# Patient Record
Sex: Female | Born: 1965 | Race: White | Hispanic: No | Marital: Married | Smoking: Former smoker
Health system: Southern US, Community
[De-identification: ages and names within clinical notes are randomized; demographics above are authoritative.]

## PROBLEM LIST (undated history)

## (undated) DIAGNOSIS — Z862 Personal history of diseases of the blood and blood-forming organs and certain disorders involving the immune mechanism: Secondary | ICD-10-CM

## (undated) DIAGNOSIS — Z8719 Personal history of other diseases of the digestive system: Secondary | ICD-10-CM

## (undated) DIAGNOSIS — M1990 Unspecified osteoarthritis, unspecified site: Secondary | ICD-10-CM

## (undated) DIAGNOSIS — E119 Type 2 diabetes mellitus without complications: Secondary | ICD-10-CM

## (undated) DIAGNOSIS — K449 Diaphragmatic hernia without obstruction or gangrene: Secondary | ICD-10-CM

## (undated) DIAGNOSIS — Z8679 Personal history of other diseases of the circulatory system: Secondary | ICD-10-CM

## (undated) DIAGNOSIS — T8859XA Other complications of anesthesia, initial encounter: Secondary | ICD-10-CM

## (undated) DIAGNOSIS — Z973 Presence of spectacles and contact lenses: Secondary | ICD-10-CM

## (undated) DIAGNOSIS — K219 Gastro-esophageal reflux disease without esophagitis: Secondary | ICD-10-CM

## (undated) DIAGNOSIS — T4145XA Adverse effect of unspecified anesthetic, initial encounter: Secondary | ICD-10-CM

## (undated) DIAGNOSIS — Z85820 Personal history of malignant melanoma of skin: Secondary | ICD-10-CM

## (undated) DIAGNOSIS — M5136 Other intervertebral disc degeneration, lumbar region: Secondary | ICD-10-CM

## (undated) DIAGNOSIS — J449 Chronic obstructive pulmonary disease, unspecified: Secondary | ICD-10-CM

## (undated) DIAGNOSIS — R351 Nocturia: Secondary | ICD-10-CM

## (undated) DIAGNOSIS — G4733 Obstructive sleep apnea (adult) (pediatric): Secondary | ICD-10-CM

## (undated) DIAGNOSIS — Z8544 Personal history of malignant neoplasm of other female genital organs: Secondary | ICD-10-CM

## (undated) DIAGNOSIS — Z9884 Bariatric surgery status: Secondary | ICD-10-CM

## (undated) DIAGNOSIS — Z87442 Personal history of urinary calculi: Secondary | ICD-10-CM

## (undated) DIAGNOSIS — Z9889 Other specified postprocedural states: Secondary | ICD-10-CM

## (undated) DIAGNOSIS — D071 Carcinoma in situ of vulva: Secondary | ICD-10-CM

## (undated) DIAGNOSIS — M5126 Other intervertebral disc displacement, lumbar region: Secondary | ICD-10-CM

## (undated) DIAGNOSIS — Z87898 Personal history of other specified conditions: Secondary | ICD-10-CM

## (undated) HISTORY — PX: KNEE ARTHROSCOPY: SUR90

## (undated) HISTORY — PX: CARDIAC CATHETERIZATION: SHX172

## (undated) HISTORY — PX: EXTRACORPOREAL SHOCK WAVE LITHOTRIPSY: SHX1557

## (undated) HISTORY — DX: Gastro-esophageal reflux disease without esophagitis: K21.9

---
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
## 1998-10-25 ENCOUNTER — Ambulatory Visit (HOSPITAL_COMMUNITY): Admission: RE | Admit: 1998-10-25 | Discharge: 1998-10-25 | Payer: Self-pay | Admitting: Obstetrics and Gynecology

## 1998-10-25 ENCOUNTER — Encounter: Payer: Self-pay | Admitting: Obstetrics and Gynecology

## 1999-01-24 ENCOUNTER — Other Ambulatory Visit: Admission: RE | Admit: 1999-01-24 | Discharge: 1999-01-24 | Payer: Self-pay | Admitting: Obstetrics and Gynecology

## 1999-04-26 ENCOUNTER — Emergency Department (HOSPITAL_COMMUNITY): Admission: EM | Admit: 1999-04-26 | Discharge: 1999-04-27 | Payer: Self-pay | Admitting: Emergency Medicine

## 1999-10-11 ENCOUNTER — Encounter: Admission: RE | Admit: 1999-10-11 | Discharge: 2000-01-09 | Payer: Self-pay | Admitting: Obstetrics and Gynecology

## 2000-01-16 ENCOUNTER — Ambulatory Visit (HOSPITAL_COMMUNITY): Admission: RE | Admit: 2000-01-16 | Discharge: 2000-01-16 | Payer: Self-pay | Admitting: Obstetrics and Gynecology

## 2000-01-16 ENCOUNTER — Encounter: Payer: Self-pay | Admitting: Obstetrics and Gynecology

## 2000-02-24 ENCOUNTER — Encounter: Payer: Self-pay | Admitting: Obstetrics and Gynecology

## 2000-02-24 ENCOUNTER — Ambulatory Visit (HOSPITAL_COMMUNITY): Admission: RE | Admit: 2000-02-24 | Discharge: 2000-02-24 | Payer: Self-pay | Admitting: Obstetrics and Gynecology

## 2000-02-26 ENCOUNTER — Ambulatory Visit: Admission: AD | Admit: 2000-02-26 | Discharge: 2000-02-26 | Payer: Self-pay | Admitting: Obstetrics and Gynecology

## 2000-03-02 ENCOUNTER — Inpatient Hospital Stay (HOSPITAL_COMMUNITY): Admission: AD | Admit: 2000-03-02 | Discharge: 2000-03-06 | Payer: Self-pay | Admitting: Obstetrics & Gynecology

## 2000-03-07 ENCOUNTER — Encounter: Admission: RE | Admit: 2000-03-07 | Discharge: 2000-04-21 | Payer: Self-pay | Admitting: Obstetrics and Gynecology

## 2000-06-29 ENCOUNTER — Ambulatory Visit (HOSPITAL_COMMUNITY): Admission: RE | Admit: 2000-06-29 | Discharge: 2000-06-29 | Payer: Self-pay | Admitting: Internal Medicine

## 2001-02-23 ENCOUNTER — Ambulatory Visit (HOSPITAL_COMMUNITY): Admission: RE | Admit: 2001-02-23 | Discharge: 2001-02-23 | Payer: Self-pay | Admitting: Obstetrics and Gynecology

## 2001-02-23 ENCOUNTER — Encounter: Payer: Self-pay | Admitting: Obstetrics and Gynecology

## 2001-07-07 ENCOUNTER — Inpatient Hospital Stay (HOSPITAL_COMMUNITY): Admission: AD | Admit: 2001-07-07 | Discharge: 2001-07-10 | Payer: Self-pay | Admitting: Obstetrics and Gynecology

## 2001-07-07 ENCOUNTER — Encounter (INDEPENDENT_AMBULATORY_CARE_PROVIDER_SITE_OTHER): Payer: Self-pay | Admitting: Specialist

## 2003-04-07 ENCOUNTER — Emergency Department (HOSPITAL_COMMUNITY): Admission: EM | Admit: 2003-04-07 | Discharge: 2003-04-07 | Payer: Self-pay | Admitting: Emergency Medicine

## 2003-04-07 ENCOUNTER — Encounter: Payer: Self-pay | Admitting: Emergency Medicine

## 2003-07-12 ENCOUNTER — Emergency Department (HOSPITAL_COMMUNITY): Admission: EM | Admit: 2003-07-12 | Discharge: 2003-07-12 | Payer: Self-pay | Admitting: Emergency Medicine

## 2003-07-12 ENCOUNTER — Encounter: Payer: Self-pay | Admitting: Emergency Medicine

## 2003-07-19 ENCOUNTER — Ambulatory Visit (HOSPITAL_BASED_OUTPATIENT_CLINIC_OR_DEPARTMENT_OTHER): Admission: RE | Admit: 2003-07-19 | Discharge: 2003-07-19 | Payer: Self-pay | Admitting: Urology

## 2003-07-19 HISTORY — PX: OTHER SURGICAL HISTORY: SHX169

## 2005-01-18 ENCOUNTER — Emergency Department (HOSPITAL_COMMUNITY): Admission: EM | Admit: 2005-01-18 | Discharge: 2005-01-18 | Payer: Self-pay | Admitting: Emergency Medicine

## 2005-01-23 ENCOUNTER — Encounter: Admission: RE | Admit: 2005-01-23 | Discharge: 2005-01-23 | Payer: Self-pay | Admitting: Orthopaedic Surgery

## 2007-06-09 ENCOUNTER — Emergency Department (HOSPITAL_COMMUNITY): Admission: EM | Admit: 2007-06-09 | Discharge: 2007-06-09 | Payer: Self-pay | Admitting: Emergency Medicine

## 2007-07-31 ENCOUNTER — Emergency Department (HOSPITAL_COMMUNITY): Admission: EM | Admit: 2007-07-31 | Discharge: 2007-08-01 | Payer: Self-pay | Admitting: Emergency Medicine

## 2007-10-13 ENCOUNTER — Encounter: Admission: RE | Admit: 2007-10-13 | Discharge: 2007-10-14 | Payer: Self-pay | Admitting: Surgery

## 2007-10-16 ENCOUNTER — Ambulatory Visit (HOSPITAL_BASED_OUTPATIENT_CLINIC_OR_DEPARTMENT_OTHER): Admission: RE | Admit: 2007-10-16 | Discharge: 2007-10-16 | Payer: Self-pay | Admitting: Surgery

## 2007-10-21 ENCOUNTER — Ambulatory Visit (HOSPITAL_COMMUNITY): Admission: RE | Admit: 2007-10-21 | Discharge: 2007-10-21 | Payer: Self-pay | Admitting: Surgery

## 2007-10-24 ENCOUNTER — Ambulatory Visit: Payer: Self-pay | Admitting: Internal Medicine

## 2007-10-25 ENCOUNTER — Ambulatory Visit (HOSPITAL_COMMUNITY): Admission: RE | Admit: 2007-10-25 | Discharge: 2007-10-25 | Payer: Self-pay | Admitting: Surgery

## 2007-12-13 ENCOUNTER — Ambulatory Visit (HOSPITAL_COMMUNITY): Admission: RE | Admit: 2007-12-13 | Discharge: 2007-12-13 | Payer: Self-pay | Admitting: Surgery

## 2008-01-31 ENCOUNTER — Encounter: Admission: RE | Admit: 2008-01-31 | Discharge: 2008-04-30 | Payer: Self-pay | Admitting: Surgery

## 2008-02-14 ENCOUNTER — Ambulatory Visit (HOSPITAL_COMMUNITY): Admission: RE | Admit: 2008-02-14 | Discharge: 2008-02-15 | Payer: Self-pay | Admitting: Surgery

## 2008-02-14 HISTORY — PX: LAPAROSCOPIC GASTRIC BANDING: SHX1100

## 2008-02-19 ENCOUNTER — Emergency Department (HOSPITAL_COMMUNITY): Admission: EM | Admit: 2008-02-19 | Discharge: 2008-02-19 | Payer: Self-pay | Admitting: Emergency Medicine

## 2008-04-21 ENCOUNTER — Ambulatory Visit (HOSPITAL_COMMUNITY): Admission: RE | Admit: 2008-04-21 | Discharge: 2008-04-21 | Payer: Self-pay | Admitting: Urology

## 2008-04-28 ENCOUNTER — Encounter: Admission: RE | Admit: 2008-04-28 | Discharge: 2008-04-28 | Payer: Self-pay | Admitting: Surgery

## 2008-06-20 ENCOUNTER — Ambulatory Visit (HOSPITAL_COMMUNITY): Admission: RE | Admit: 2008-06-20 | Discharge: 2008-06-20 | Payer: Self-pay | Admitting: Urology

## 2008-07-03 ENCOUNTER — Ambulatory Visit (HOSPITAL_COMMUNITY): Admission: RE | Admit: 2008-07-03 | Discharge: 2008-07-03 | Payer: Self-pay | Admitting: Urology

## 2008-07-27 ENCOUNTER — Ambulatory Visit (HOSPITAL_BASED_OUTPATIENT_CLINIC_OR_DEPARTMENT_OTHER): Admission: RE | Admit: 2008-07-27 | Discharge: 2008-07-27 | Payer: Self-pay | Admitting: Orthopedic Surgery

## 2008-07-27 HISTORY — PX: ELBOW DEBRIDEMENT: SHX931

## 2008-10-05 ENCOUNTER — Ambulatory Visit (HOSPITAL_COMMUNITY): Admission: RE | Admit: 2008-10-05 | Discharge: 2008-10-05 | Payer: Self-pay | Admitting: Urology

## 2008-10-05 IMAGING — CR DG ABDOMEN 1V
2 series · 2 of 2 positions shown · non-contrast
Comparison: [DATE]

CLINICAL DATA: Left renal calculus.

ABDOMEN - 1 VIEW

[t abdomen supine *]
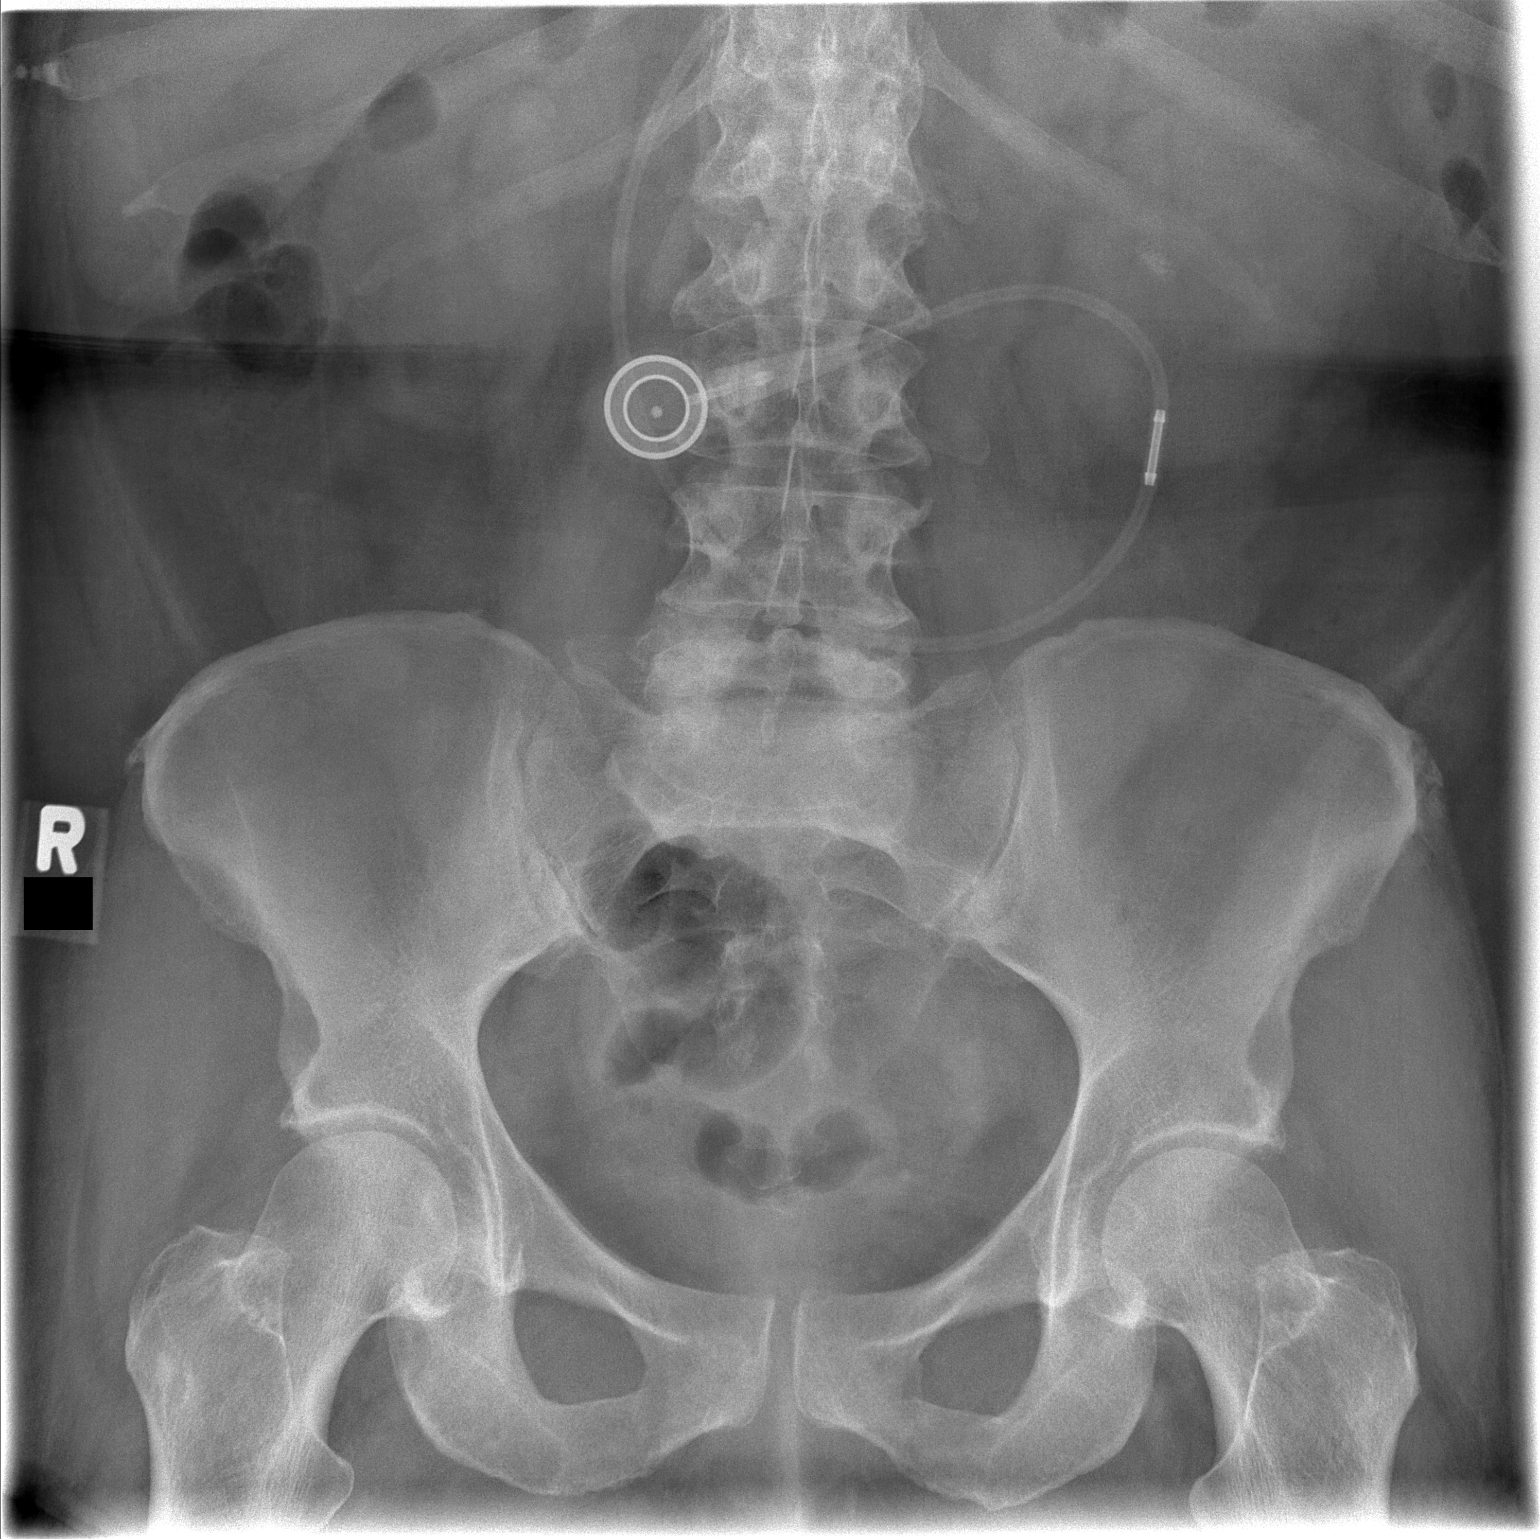

[t abdomen supine]
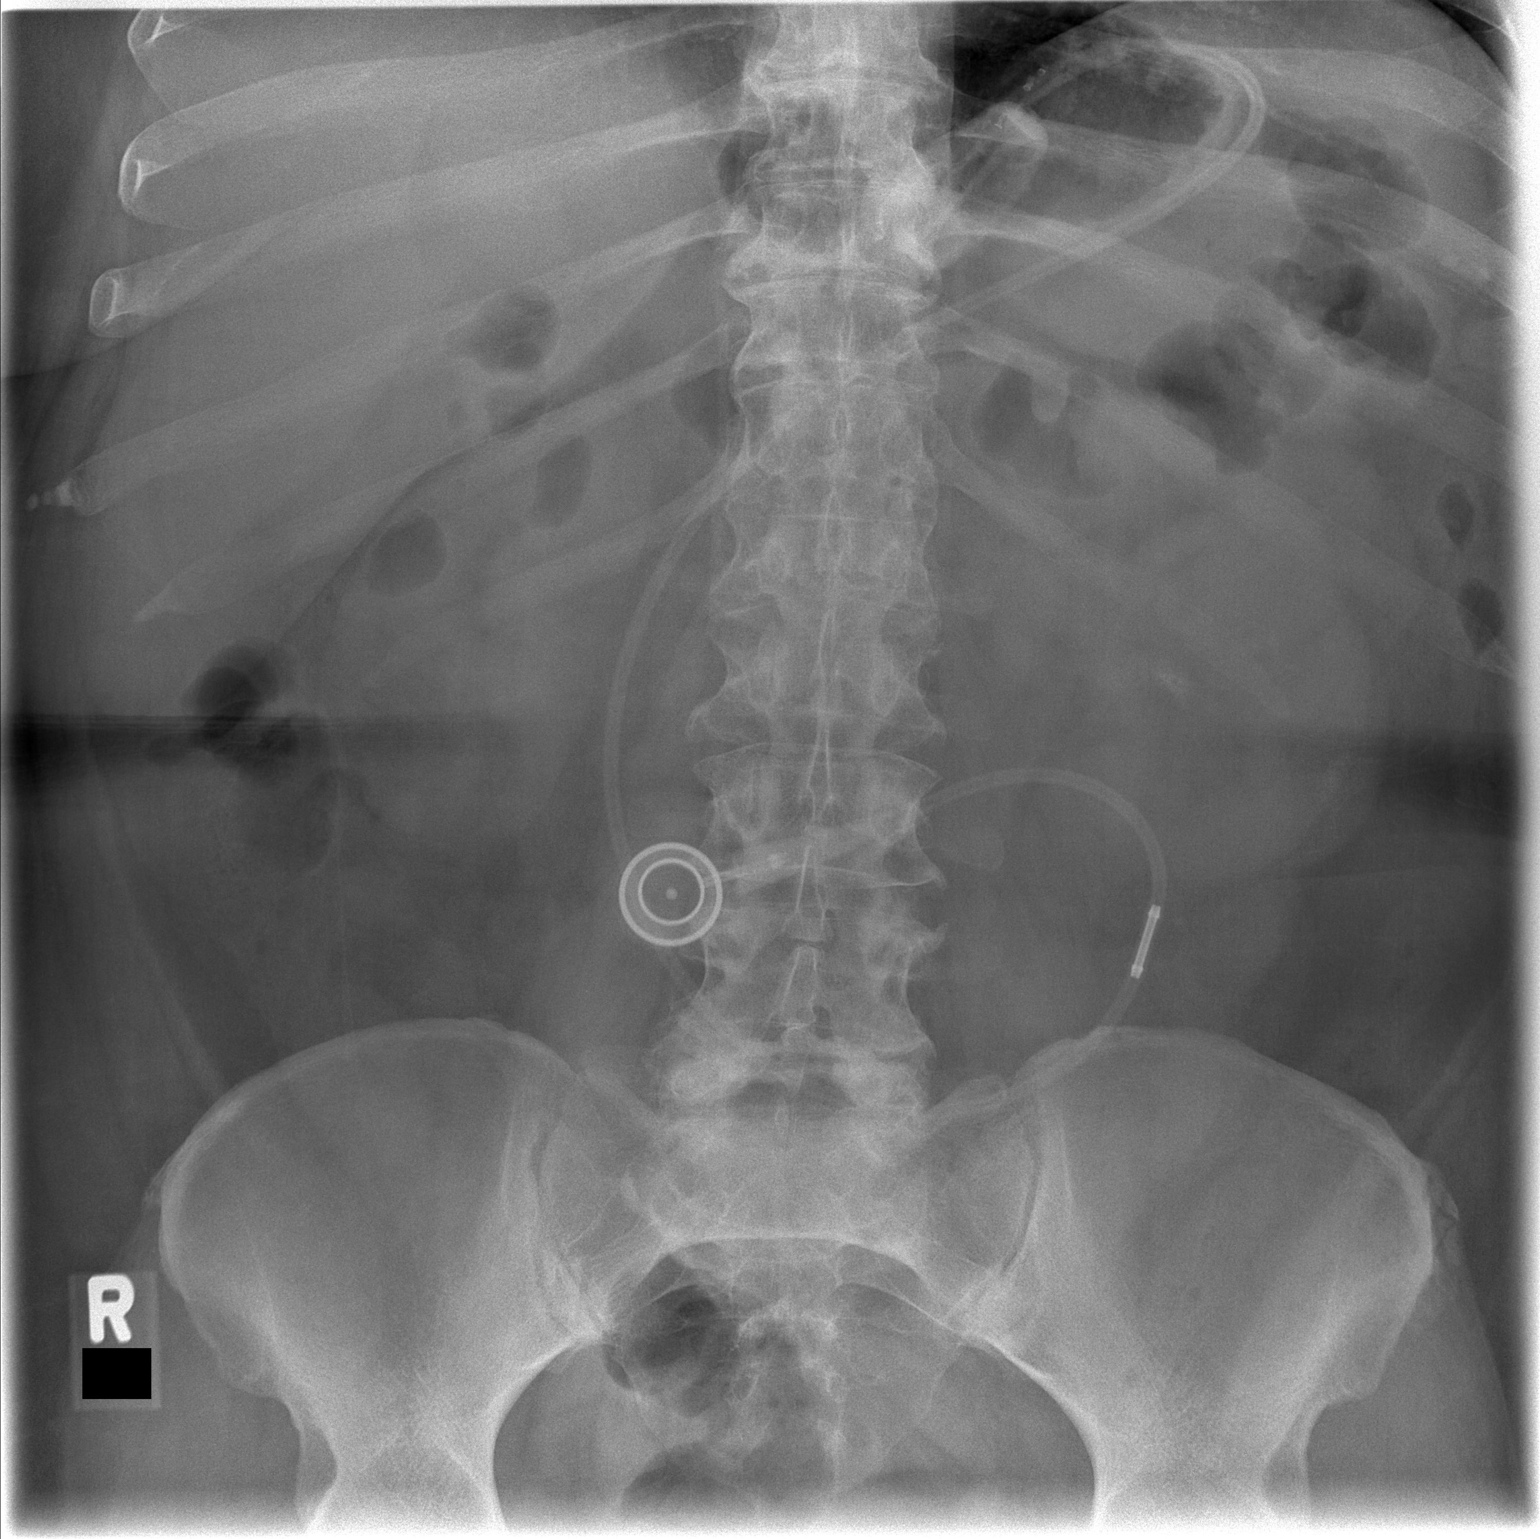

[2 of 2 positions shown; findings below may reference images not displayed]

FINDINGS: The patient has a left and with port in place.  A
calculus overlies the left renal contour, measuring 10 mm in
diameter.  Bowel gas pattern is nonobstructive.
IMPRESSION: Stable appearance of left renal calculus.

## 2008-12-07 ENCOUNTER — Ambulatory Visit (HOSPITAL_BASED_OUTPATIENT_CLINIC_OR_DEPARTMENT_OTHER): Admission: RE | Admit: 2008-12-07 | Discharge: 2008-12-07 | Payer: Self-pay | Admitting: Orthopedic Surgery

## 2008-12-11 ENCOUNTER — Ambulatory Visit (HOSPITAL_COMMUNITY): Admission: RE | Admit: 2008-12-11 | Discharge: 2008-12-11 | Payer: Self-pay | Admitting: General Surgery

## 2008-12-19 ENCOUNTER — Ambulatory Visit (HOSPITAL_COMMUNITY): Admission: RE | Admit: 2008-12-19 | Discharge: 2008-12-20 | Payer: Self-pay | Admitting: Surgery

## 2008-12-19 ENCOUNTER — Encounter (INDEPENDENT_AMBULATORY_CARE_PROVIDER_SITE_OTHER): Payer: Self-pay | Admitting: Surgery

## 2008-12-19 HISTORY — PX: OTHER SURGICAL HISTORY: SHX169

## 2010-03-19 ENCOUNTER — Ambulatory Visit (HOSPITAL_COMMUNITY): Admission: RE | Admit: 2010-03-19 | Discharge: 2010-03-19 | Payer: Self-pay | Admitting: Obstetrics and Gynecology

## 2010-03-19 HISTORY — PX: OTHER SURGICAL HISTORY: SHX169

## 2010-04-03 ENCOUNTER — Ambulatory Visit: Admission: RE | Admit: 2010-04-03 | Discharge: 2010-04-03 | Payer: Self-pay | Admitting: Gynecologic Oncology

## 2010-04-11 ENCOUNTER — Ambulatory Visit (HOSPITAL_COMMUNITY): Admission: RE | Admit: 2010-04-11 | Discharge: 2010-04-11 | Payer: Self-pay | Admitting: Gynecologic Oncology

## 2010-05-28 ENCOUNTER — Ambulatory Visit: Admission: RE | Admit: 2010-05-28 | Discharge: 2010-05-28 | Payer: Self-pay | Admitting: Gynecologic Oncology

## 2010-06-11 ENCOUNTER — Ambulatory Visit (HOSPITAL_COMMUNITY): Admission: RE | Admit: 2010-06-11 | Discharge: 2010-06-12 | Payer: Self-pay | Admitting: Obstetrics & Gynecology

## 2010-06-11 ENCOUNTER — Encounter: Payer: Self-pay | Admitting: Obstetrics & Gynecology

## 2010-06-11 HISTORY — PX: OTHER SURGICAL HISTORY: SHX169

## 2010-07-03 ENCOUNTER — Ambulatory Visit: Admission: RE | Admit: 2010-07-03 | Discharge: 2010-07-03 | Payer: Self-pay | Admitting: Gynecologic Oncology

## 2010-09-12 ENCOUNTER — Ambulatory Visit
Admission: RE | Admit: 2010-09-12 | Discharge: 2010-09-12 | Payer: Self-pay | Source: Home / Self Care | Admitting: Gynecologic Oncology

## 2010-11-24 ENCOUNTER — Encounter: Payer: Self-pay | Admitting: General Surgery

## 2011-01-08 ENCOUNTER — Other Ambulatory Visit: Payer: Self-pay | Admitting: Gynecologic Oncology

## 2011-01-08 ENCOUNTER — Ambulatory Visit: Payer: 59 | Attending: Gynecologic Oncology | Admitting: Gynecologic Oncology

## 2011-01-08 ENCOUNTER — Other Ambulatory Visit (HOSPITAL_COMMUNITY)
Admission: RE | Admit: 2011-01-08 | Discharge: 2011-01-08 | Disposition: A | Discharge status: Home / Self Care | Payer: 59 | Source: Ambulatory Visit | Attending: Gynecologic Oncology | Admitting: Gynecologic Oncology

## 2011-01-08 DIAGNOSIS — Z8540 Personal history of malignant neoplasm of unspecified female genital organ: Secondary | ICD-10-CM | POA: Insufficient documentation

## 2011-01-08 DIAGNOSIS — K625 Hemorrhage of anus and rectum: Secondary | ICD-10-CM | POA: Insufficient documentation

## 2011-01-08 DIAGNOSIS — C519 Malignant neoplasm of vulva, unspecified: Secondary | ICD-10-CM | POA: Insufficient documentation

## 2011-01-17 LAB — CBC
Hemoglobin: 12.6 g/dL (ref 12.0–15.0)
MCH: 29.7 pg (ref 26.0–34.0)
MCHC: 33.5 g/dL (ref 30.0–36.0)
MCV: 88.6 fL (ref 78.0–100.0)

## 2011-01-17 LAB — COMPREHENSIVE METABOLIC PANEL
AST: 16 U/L (ref 0–37)
Alkaline Phosphatase: 76 U/L (ref 39–117)
BUN: 6 mg/dL (ref 6–23)
CO2: 25 mEq/L (ref 19–32)
Calcium: 8.8 mg/dL (ref 8.4–10.5)
Creatinine, Ser: 0.65 mg/dL (ref 0.4–1.2)
GFR calc Af Amer: >60 mL/min (ref >60)

## 2011-01-17 LAB — HEMOGLOBIN A1C
Hgb A1c MFr Bld: 7.3 % — ABNORMAL HIGH (ref <5.7)
Mean Plasma Glucose: 163 mg/dL — ABNORMAL HIGH (ref <117)

## 2011-01-17 LAB — DIFFERENTIAL
Basophils Relative: 1 % (ref 0–1)
Eosinophils Absolute: 0.1 10*3/uL (ref 0.0–0.7)
Monocytes Absolute: 0.4 10*3/uL (ref 0.1–1.0)
Monocytes Relative: 5 % (ref 3–12)

## 2011-01-17 LAB — GLUCOSE, CAPILLARY: Glucose-Capillary: 100 mg/dL — ABNORMAL HIGH (ref 70–99)

## 2011-01-20 LAB — BASIC METABOLIC PANEL
BUN: 4 mg/dL — ABNORMAL LOW (ref 6–23)
Calcium: 8.7 mg/dL (ref 8.4–10.5)
Creatinine, Ser: 0.6 mg/dL (ref 0.4–1.2)
GFR calc non Af Amer: >60 mL/min (ref >60)
Glucose, Bld: 104 mg/dL — ABNORMAL HIGH (ref 70–99)

## 2011-01-20 LAB — CBC
Platelets: 299 10*3/uL (ref 150–400)
RDW: 13.8 % (ref 11.5–15.5)

## 2011-01-24 NOTE — Consult Note (Signed)
NAME:  Ariel, Stewart NO.:  [id number]  MEDICAL RECORD NO.:  [id number]           PATIENT TYPE:  LOCATION:                                 FACILITY:  PHYSICIAN:  Marquette Blodgett A. Duard Brady, MD    DATE OF BIRTH:  January 23, 1966  DATE OF CONSULTATION:  01/08/2011 DATE OF DISCHARGE:                                CONSULTATION   HISTORY:  Ms. Zwiebel is a 45 year old with stage IB vulvar carcinoma. She saw Dr. Dareen Piano initially in May 2011.  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  PET-CT revealed no evidence of any abnormal uptake.  There was 1 external iliac node, measuring 1.2 cm with no uptake.  She underwent repeat wide local excision and bilateral groin dissection in August 2011.  There was a 2-cm left groin node that was slightly enlarged but soft and pathology on the right side was 0 out of 5 and on the left 0 out of 2 lymph nodes being involved.  The vulva revealed focal high-grade dysplasia extending to the margins, but there was no residual invasive carcinoma.  I last saw her in November of 2011, at which time her exam was unremarkable.  She comes in today for followup.  Since we last saw her, she has had some episodes of abdominal pain.  Per her report, she had a CT scan ordered by her primary care physician that was essentially negative.  She was having some rectal bleeding and she underwent a colonoscopy in the early part of February that showed a benign polyp and some hemorrhoids.  This was all done in [place], again we do not have the records.  She states that on Monday she had an episode of some left lower quadrant discomfort.  She felt that she was going to have diarrhea.  When she went to the restroom, she states that it essentially  filled the commode with blood with some small clots.  This was on Monday evening and has not happened since that time.  She states that she did have a followup with a gastroenterologist but failed to keep that appointment and she has not called them about this most recent episode.  She otherwise has been doing fairly well.  She does complain of some occasional right lower quadrant discomfort when she has an orgasm but no dyspareunia.  She denies any vaginal bleeding.  She states her husband has been looking at her vulva and they have not seen any lesions.  She has felt no pruritus and has not felt anything.  Review of systems is otherwise negative. She has gained about 10 pounds since we last saw her but she attributes that to stress and eating due to the holidays.  Her husband is still seeking appointment since his layoff.  PHYSICAL EXAMINATION:  VITAL SIGNS:  Weight 260 pounds, up from 250 pounds, blood pressure 122/70, pulse 82. GENERAL:  Well-nourished, well-developed female, in no  acute distress. NECK:  Supple.  There is no lymphadenopathy, no thyromegaly. LUNGS:  Clear to auscultation bilaterally. CARDIOVASCULAR:  Regular rate and rhythm. ABDOMEN:  Morbidly obese, soft, nontender, nondistended.  There are no palpable masses or hepatosplenomegaly but exam is limited by habitus. Groins are negative for adenopathy. EXTREMITIES:  There is no edema.  She does have varicose veins. PELVIC:  External genitalia is notable for surgical excision.  There are no visible lesions.  The vagina is well epithelialized.  The cervix is difficult to visualize secondary to the length of the speculum and the patient's habitus.  However, there were no gross visible lesions. ThinPrep Pap was submitted without difficulty.  Bimanual examination reveals no cervical motion tenderness.  Uterus is not enlarged, but again exam is limited by habitus.  There were no adnexal masses.  ASSESSMENT:  45 year old with  stage IB vulvar carcinoma, who clinically has no evidence of recurrent disease.  PLAN: 1. Will follow up with results of her Pap smear from today. 2. With regard to her rectal bleeding, she does have a fairly     prominent external hemorrhoid but I am not sure that is the cause     of the bleeding she had.  She was encouraged to call the     gastroenterologist for followup recommendations. 3. She did sign a release of information form so we could get records     from her CT scan as well as colonoscopy. 4. She will return to see [place] in 4 months and will follow up on the     results of her Pap smear.     Takia Runyon A. Duard Brady, MD     PAG/MEDQ  D:  01/08/2011  T:  01/08/2011  Job:  [id number]  cc:   Malva Limes, M.D. Fax: [phone]  Telford Nab, R.N. [address] [place], [place] [address]  Thornton Park Daphine Deutscher, MD [address], [address] [place] [address]  Dr. Primus Bravo  Electronically Signed by Cleda Mccreedy MD on 01/09/2011 11:42:20 AM

## 2011-02-18 LAB — GLUCOSE, CAPILLARY
Glucose-Capillary: 101 mg/dL — ABNORMAL HIGH (ref 70–99)
Glucose-Capillary: 125 mg/dL — ABNORMAL HIGH (ref 70–99)
Glucose-Capillary: 141 mg/dL — ABNORMAL HIGH (ref 70–99)

## 2011-02-18 LAB — BASIC METABOLIC PANEL
BUN: 10 mg/dL (ref 6–23)
Calcium: 8.9 mg/dL (ref 8.4–10.5)
Calcium: 9.4 mg/dL (ref 8.4–10.5)
GFR calc Af Amer: >60 mL/min (ref >60)
GFR calc non Af Amer: >60 mL/min (ref >60)
GFR calc non Af Amer: >60 mL/min (ref >60)
Potassium: 4.5 mEq/L (ref 3.5–5.1)
Sodium: 139 mEq/L (ref 135–145)

## 2011-02-18 LAB — CBC
HCT: 39.6 % (ref 36.0–46.0)
MCHC: 34.8 g/dL (ref 30.0–36.0)
Platelets: 321 10*3/uL (ref 150–400)
RDW: 14.7 % (ref 11.5–15.5)

## 2011-02-18 LAB — POCT HEMOGLOBIN-HEMACUE: Hemoglobin: 14.7 g/dL (ref 12.0–15.0)

## 2011-03-18 NOTE — Op Note (Signed)
NAME:  Ariel Stewart, REVARD NO.:  [id number]   MEDICAL RECORD NO.:  [id number]          PATIENT TYPE:  AMB   LOCATION:  DSC                          FACILITY:  [facility]   PHYSICIAN:  Loreta Ave, M.D. 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   PROCEDURE:  1. Left elbow exploration with debridement of extensor carpi radialis      brevis tendon.  2. Arthrotomy.  3. Drilling and debridement, epicondyle.  4. Primary repair of deep to superficial extensors.   SURGEON:  Loreta Ave, MD   ASSISTANT:  Genene Churn. Barry Dienes, [place]   ANESTHESIA:  General.   BLOOD LOSS:  Minimal.   SPECIMENS:  None.   COMPLICATIONS:  None.   DRESSING:  Soft compressive with a long-arm splint.   TOURNIQUET TIME:  45 minutes.   PROCEDURE:  The patient was brought to the operating room and placed on  the operating table in the supine position.  After adequate anesthesia  had been obtained, tourniquet was applied on the upper aspect of the  left arm.  Prepped and draped in the usual sterile fashion.  Exsanguinated with elevation and Esmarch, tourniquet was inflated to 250  mmHg.  Elbow was examined.  Full motion, good stability.  Longitudinal  incision from the epicondyle, distal.  Skin and subcutaneous tissue were  divided.  Superficial extensors were intact.  Divided longitudinally.  Entire ECRB origin torn off with marked mucinous degeneration, debris,  and inflammatory tissue.  This included the capsule which was completely  torn as well.  All of this was debrided back to healthy tissue down to  the level of the radial head.  Joint was  exposed, explored, and  inspected.  No evidence of chondromalacia or other degenerative changes.  Thorough irrigation.  Epicondyle was then denuded back to healthy  tissue, treated with multiple drilling to allow for healing.  Wound was  irrigated.  Superficial extensors was brought over the top of the  epicondyle and deep  extensor and closed with Vicryl.  Skin and subcutaneous tissue with  Vicryl.  Steri-Strips was applied.  Sterile compressive dressing was  applied.  A long-arm sugar-tong splint was applied.  Tourniquet was  deflated and removed.  Anesthesia was reversed.  Brought to the recovery  room.  Tolerated surgery well.  No complications.      Loreta Ave, M.D.  Electronically Signed     DFM/MEDQ  D:  07/27/2008  T:  07/28/2008  Job:  [id number]

## 2011-03-18 NOTE — Op Note (Signed)
NAME:  Ariel Stewart, LUNDBLAD NO.:  [id number]   MEDICAL RECORD NO.:  [id number]          PATIENT TYPE:  OIB   LOCATION:  5128                         FACILITY:  [facility]   PHYSICIAN:  Thornton Park. Daphine Deutscher, MD  DATE OF BIRTH:  1966-04-01   DATE OF PROCEDURE:  12/19/2008  DATE OF DISCHARGE:                               OPERATIVE REPORT   PREOPERATIVE DIAGNOSIS:  Clark's level III early level IV, Breslow 1-mm  melanoma of back with more involved margins on her biopsy done on  November 24, 2008.   POSTOPERATIVE DIAGNOSIS:  Clark's level III early level IV, Breslow 1-mm  melanoma of back with more involved margins on her biopsy done on  November 24, 2008.   SURGEON:  Thornton Park. Daphine Deutscher, MD   ANESTHESIA:  General endotracheal in both the supine and prone  positions.   PROCEDURES:  1. Bilateral axillary sentinel lymph node mapping and bilateral      axillary sentinel lymph node biopsies.  2. Prone position wide-excision melanoma 4.5 x 13 cm wide and long      incision with a depth of probably 4 cm to the fascia.  Primary      closure in multiple layers.   DESCRIPTION OF PROCEDURE:  [name] was taken to room #16 on  December 19, 2008.  Previously lymph node mapping had shown bilateral  hot nodes.  When she was placed on her back, which was towards to the  middle of the back was injected.  Therefore, she was injected this  morning and that area was identified with her assistance in the holding  area and marked.  I then mapped her axilla when we got her back and put  her in sleep.  I worked on the left side first and she had a very deep  nodal cluster.  I stayed down and dissected around and I was operating  probably a depth of 5-6 inches, but I was able to remove the hot nodes  on the left side, putting clips on the lymphovascular inflow areas.  I  then irrigated and then put in some Marcaine and closed this with 4-0  Vicryl and Dermabond.  Similarly, I worked on the  right side with  different instruments and new gloves and was able to find a larger nodal  cluster deep.  Again, in both cases I mapped the background of removing  the hot node and there was a very very limited to nonaccount.   Once these were completed and closed, the patient was then moved over on  to the stretcher and then flipped back on the bed in the prone position  with endotracheal tube secured.  The back had been previously marked and  was now prepped and the transversely oriented excision was done.  I put  a suture marking in the inferior margin and took this down off the  fascia using the electrocautery.  Once it was done, I raised flaps on  the fascial level inferiorly and superiorly and then closed in multiple  layers with 3-0 Vicryl, 4-0 Vicryl, and then with a running  suture of 4-  0 nylon.   Sterile dressings were applied.  The patient tolerated the procedure  well and was taken to recovery room.  Likely, she will be given the  option to stay overnight if necessary.      Thornton Park Daphine Deutscher, MD  Electronically Signed     MBM/MEDQ  D:  12/19/2008  T:  12/20/2008  Job:  [id number]   cc:   Paulene Floor, NP  Bufford Buttner, MD

## 2011-03-18 NOTE — Op Note (Signed)
NAME:  KINZLEIGH, KANDLER NO.:  [id number]   MEDICAL RECORD NO.:  [id number]          PATIENT TYPE:  AMB   LOCATION:  DAY                          FACILITY:  [facility]   PHYSICIAN:  Thornton Park. Daphine Deutscher, MD  DATE OF BIRTH:  1966-03-28   DATE OF PROCEDURE:  02/14/2008  DATE OF DISCHARGE:                               OPERATIVE REPORT   CCS NUMBER:  [id number]   PREOPERATIVE DIAGNOSIS:  Morbid obesity, BMI about 50 with upper  gastrointestinal showing sliding hiatal hernia.   POSTOPERATIVE DIAGNOSIS:  Morbid obesity, BMI about 50 with upper  gastrointestinal showing sliding hiatal hernia.   PROCEDURE:  Lap band placement, APS system, with closure of the hiatal  hernia with two sutures posteriorly.   SURGEON:  Thornton Park. Daphine Deutscher, MD.   ASSISTANTMarland Kitchen  Sandria Bales. Ezzard Standing, M.D.   ANESTHESIA:  General endotracheal.   DESCRIPTION OF PROCEDURE:  Ms. Sukhu is a 45 year old white female  taken back to room #1 on Monday, February 14, 2008, given general  anesthesia.  The abdomen was prepped with Techni-Care and draped  sterilely.  I entered the abdomen through the left upper quadrant using  a OptiVu technique 0 degree 10 mm scope entering without difficulty and  insufflating the abdomen.  Standard trocar placement  was used including  the 15 in the upper port on the right and the Nathanson retractor to  retract the liver.  First I began on the left side and made a little  window there along the left side of stomach for the band and then went  down for the pars flaccida approach but went ahead and went a little  higher and dissected free the right crus from the esophagus.  There I  found evidence of reflux disease and some inflammatory changes, so I  went ahead and freed that up posteriorly, removed a small, probably 2-3  cm, lipoma lodged in there and as I removed that, then I was able to see  the right and left crus through which I then placed two sutures  posteriorly of 2-0 Ethibond and  securing those with a tie knot.   The band was then passed from a lower position in the pars flaccida  technique and the band passer went across easily and the band was  threaded on and brought around and buckled.  It was then held over and  down as I plicated it with three sutures using the free sutures of woven  polyester Ethibond suture.  They were then secured with tie knots.  Looked good, when I completed it.  There was no bleeding noted.  I then  brought the tip out through the lower port on the right, removed the  liver retractor and deflated the abdomen.  Port site was made in that  site, there was a little bit of bleeding which I controlled with  electrocautery, cleared out on the fascia a nice plane for the port to  be secured.  Four sutures of 2-0 Prolene were placed holding the band to  the fascia.  These were tied down after it  was  secured to the tubing and the tubing was introduced in the abdomen.  Area was irrigated and closed 4-0 Vicryl as were all the rest of the  incisions which were then closed with Benzoin  and Steri-Strips.  The  patient tolerated the procedure well and was taken to the recovery room  in satisfactory condition.      Thornton Park Daphine Deutscher, MD  Electronically Signed     MBM/MEDQ  D:  02/14/2008  T:  02/14/2008  Job:  [id number]   cc:   Primus Bravo, N.PKathryne [facility]

## 2011-03-18 NOTE — Op Note (Signed)
NAME:  Ariel Stewart, Ariel Stewart NO.:  [id number]   MEDICAL RECORD NO.:  [id number]          PATIENT TYPE:  AMB   LOCATION:  DSC                          FACILITY:  [facility]   PHYSICIAN:  Loreta Ave, M.D. 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  Grade 3 and 4 changes on  the patella.   PROCEDURE:  Left knee exam under anesthesia, arthroscopy, chondroplasty  of patella, and removal of chondral loose bodies.   SURGEON:  Loreta Ave, MD   ASSISTANT:  Genene Churn. Barry Dienes, [place]   ANESTHESIA:  Knee block with sedation.   SPECIMENS:  None.   CULTURES:  None.   COMPLICATIONS:  None.   DRESSINGS:  Soft compressive.   PROCEDURE:  The patient was brought to the operating room and placed on  the operating table in the supine position.  After adequate anesthesia  had been obtained, knee examined.  Good motion and good stability.  Lateral tracking, but no recurrent tethering after previous release.  Tourniquet and leg holder applied.  Leg was prepped and draped in usual  sterile fashion.  Three portals were created, 1 superolateral, 1 each  medial and lateral parapatellar.  Inflow catheter introduced using the  standard arthroscope was introduced, and knee inspected.  Diffuse grade  3 and 4 changes on the patella.  Some of this acute with new chondral  breakdown medially.  Chondroplasty to a stable surface.  Such diffuse  changes that microfracture really not indicated.  Mild changes on the  trochlea.  Grade 2 changes, lateral tibial plateau.  The remaining knee  looked good.  Adequate lateral release.  No meniscal tears.  Cruciate  ligaments were intact.  After assessing the patellofemoral  joint from  all angles, removing all loose bodies,  and a thorough chondroplasty, instruments and fluid removed.  Portals of  knee were injected with Marcaine.  Portals were closed with 4-0 nylon.  Sterile compressive dressing was applied.  Anesthesia reversed.  Brought  to recovery room.  Tolerated the surgery well.  No complications.      Loreta Ave, M.D.  Electronically Signed     DFM/MEDQ  D:  12/07/2008  T:  12/08/2008  Job:  [id number]

## 2011-03-18 NOTE — Procedures (Signed)
NAME:  MERDIS, SNODGRASS NO.:  [id number]   MEDICAL RECORD NO.:  [id number]          PATIENT TYPE:  OUT   LOCATION:  SLEEP CENTER                 FACILITY:  [facility]   PHYSICIAN:  Clinton D. Maple Hudson, MD, FCCP, FACPDATE OF BIRTH:  08[birth year]-65   DATE OF STUDY:                            NOCTURNAL POLYSOMNOGRAM   REFERRING PHYSICIAN:  Molli Hazard B. Daphine Deutscher, MD   REFERRING PHYSICIAN:  Dr. Luretha Murphy.   DATE OF STUDY:  10/16/2007   INDICATION FOR STUDY:  Hypersomnolence with sleep apnea.  Epworth  Sleepiness Score 16/24, BMI 50, weight 313 pounds, height 66 inches.  Neck:  17.5 inches.  Home medications, chart had been reviewed.   SLEEP ARCHITECTURE:  A total sleep time of 355 minutes with sleep  efficiency 86%.  Stage 1 was 18%, Stage 2 72%, Stage 3, 5%, Stage REM  3%.  Sleep latency 16 minutes, REM latency 303 minutes, awake capture  sleep onset 42 minutes.  Arousal index 20.8.   RESPIRATORY DATA:  Diagnostic MPSG protocol is ordered.  Apnea hypopnea  index (AHI) 3.2 obstructive events per hour, respiratory disturbance  index (RDI) 6.6 per hour indicating very mild sleep apnea/hypopnea  syndrome based on RDI.  This included a total of 4 obstructive apneas  and 13 hypopneas.  Events were not positional REM AHI 0.   OXYGEN DATA:  Very loud snoring with oxygen desaturation to a nadir of  78%.  Main oxygen saturation through the study was 92% on room air.   CARDIAC DATA:  Normal sinus rhythm.   MOVEMENT/PARASOMNIA:  No significant movement disturbance.  Bathroom  times 1.   IMPRESSION/RECOMMENDATION:  1. Minimal obstructive sleep apnea/hypopnea syndrome based on RDI 6.6      per hour (normal range 0 to 5 per hour).  AHI was 3.2 per hour.      The events were not positional.  Snoring very loud with oxygen      desaturation to a nadir of 78%.  2. CPAP therapy is generally indicated for scores in this range.  If      surgery with general anesthesia is anticipated,  then it might be reasonable to provide autotitration PAP in the      postoperative period.  Alternatively, a fixed CPAP at 10 CWP might      also be considered.      Clinton D. Maple Hudson, MD, [facility], FACP  Diplomate, Biomedical engineer of Sleep Medicine  Electronically Signed     CDY/MEDQ  D:  10/24/2007 10:57:54  T:  10/25/2007 10:14:20  Job:  [id number]

## 2011-03-18 NOTE — Consult Note (Signed)
NAME:  SYDNEI, OHAVER NO.:  [id number]   MEDICAL RECORD NO.:  [id number]          PATIENT TYPE:  EMS   LOCATION:  ED                           FACILITY:  [facility]   PHYSICIAN:  Thornton Park. Daphine Deutscher, MD  DATE OF BIRTH:  1966-02-27   DATE OF CONSULTATION:  DATE OF DISCHARGE:                                 CONSULTATION   Ms. Scheck had a lap-band on February 14, 2008.  On February 16, 2008, she got  apparently the same virus that about 6 other members of her family got  over Easter.  She started having vomiting and has been vomiting  intermittently since then.  She called today and I agreed to see her  here in the ED.  I checked her out in the holding area.  Pulse rate was  about 80.  I ordered a CBC which showed her white count to be normal at  8000 with a near normal hemoglobin.  I got upper GI Gastrografin swallow  which showed her band to be in good position and it was not obstructed.  However, she is still nauseated.  I have given her a script for Zofran 8  mg ODT and will give her one to take here in the ED.  I have also  ordered 250 or actually a liter of D5 Ringer's lactate to be given here  in the ED.  Hopefully we will let her go home later or she may have to  come in for observation if this does not suffice.      Thornton Park Daphine Deutscher, MD  Electronically Signed     MBM/MEDQ  D:  02/19/2008  T:  02/19/2008  Job:  [id number]

## 2011-03-21 NOTE — Discharge Summary (Signed)
[facility]  Patient:    Ariel Stewart, Ariel Stewart Visit Number: [id number] MRN: [id number]          Service Type: OBS Location: 910A 9142 01 Attending Physician:  Osborn Coho Dictated by:   Devoria Albe Edward Jolly, M.D. Admit Date:  07/07/2001 Discharge Date: 07/10/2001                             Discharge Summary  ADMISSION DIAGNOSES:          1. Intrauterine gestation at 39-5/7 weeks.                               2. History of prior cesarean section.                               3. Desire for repeat cesarean section.                               4. Desire for permanent sterilization.                               5. Breech presentation.                               6. Class A2 diabetes mellitus.  DISCHARGE DIAGNOSES:          1. Intrauterine gestation at 39-5/7 weeks.                               2. History of prior cesarean section.                               3. Desire for repeat cesarean section.                               4. Desire for permanent sterilization.                               5. Breech presentation.                               6. Class A2 diabetes mellitus.                               7. Status post repeat low segment transverse                                  cesarean section and bilateral tubal                                  ligation.  OPERATIONS/PROCEDURES:        Repeat low segment transverse cesarean section and bilateral tubal ligation was performed  under the direction of Dr. Malva Limes with the assistance of Dr. Ilda Mori on July 07, 2001, at the [facility].  HISTORY OF PRESENT ILLNESS:   The patient was a 45 year old gravida 3, para 1-0-1-1 Caucasian female with a history of a cesarean section in May 2001, for failure to progress and a nonreassuring fetal assessment, who desired a repeat cesarean section and permanent sterilization at the time of delivery.  The patients antepartum course  was significant for class A2 diabetes mellitus, which remained in good control during the pregnancy.  The patient was having semiweekly non-stress tests during her third trimester, and these were reactive and reassuring.  The fetus was noted to be in the breech presentation during the antepartum course.  HOSPITAL COURSE:              The patient was admitted on July 07, 2001, at which time she underwent a repeat low segment transverse cesarean section with bilateral tubal ligation under the direction of Dr. Malva Limes.  The procedure was performed under spinal anesthesia.  The estimated blood loss was 900 cc.  There were no complications.  A viable female was delivered with Apgars of 8 at one minute and 8 at five minutes.  The weight was 8 pounds and 9 ounces.  Postoperatively on the day of surgery the patient was noted to have decreased urine output of 40 cc over two hours.  The patients vital signs were stable at this time.  The patient did receive 20 mg of Lasix intravenously and had a response of good urine output following this.  The patient did have a postoperative day #1 hemoglobin which measured 9.3%.  Throughout the remainder of the patients hospital course, she continued to have good urine output and stable vital signs.  The patient was treated with Humalog Regular Insulin subcutaneously for control of her blood sugars.  The patient did not demonstrate any erythema or drainage of the incision.  She remained afebrile throughout her hospital course.  At the time of the patients discharge she was able to ambulate independently, tolerate a diabetic diet of regular food, void spontaneously, and had good control of her pain with Percocet.  The patient had a slight papular rash noted on her buttocks and her lower back but nowhere else on her skin.  It was uncertain that this really represented an allergic reaction to any of the medications that she was taking.  The patient  had tolerated all of her current medications well in the past.  The patient was found to be ready for discharge and in good condition on July 10, 2001.  DISCHARGE INSTRUCTIONS:       1. A 2000 calorie ADA diet.                               2. The patient will continue to do her                                  fingerstick blood glucose measurements at                                  home and will continue on her insulin using  previously defined parameters throughout her                                  antepartum course if necessary.                               3. She will continue to take Percocet 1-2 p.o.                                  q.4-6h. p.r.n.  She will also continue with a                                  daily prenatal vitamin and iron sulfate                                  325 mg p.o. t.i.d.                               4. The patient will have decreased activity for                                  the next six weeks.  FOLLOW-UP:                    She will follow up in the office in four weeks. She will call if she experiences any worsening of the rash, fever, nausea, vomiting, incisional drainage or redness, heavy vaginal bleeding, or any other concern.Dictated by:   Devoria Albe Edward Jolly, M.D. Attending Physician:  Osborn Coho DD:  08/02/01 TD:  08/02/01 Job: [id number]

## 2011-03-21 NOTE — Op Note (Signed)
   NAME:  ZALA, DEGRASSE NO.:  [id number]   MEDICAL RECORD NO.:  [id number]                   PATIENT TYPE:  AMB   LOCATION:  [facility]                                 FACILITY:  [facility]   PHYSICIAN:  Boston Service, M.D.             DATE OF BIRTH:  1966/03/15   DATE OF PROCEDURE:  07/19/2003  DATE OF DISCHARGE:                                 OPERATIVE REPORT   PREOPERATIVE DIAGNOSIS:  A 45 year old female with longstanding calculi,  left distal ureter.  Reference is made to CT scan from 07/12/03 and  04/07/03.   POSTOPERATIVE DIAGNOSIS:  A 45 year old female with longstanding calculi,  left distal ureter.  Reference is made to CT scan from 07/12/03 and  04/07/03.   PROCEDURE:  1. Cystoscopy.  2. Retrograde ureteroscopy.  3. Stone manipulation.   ANESTHESIA:  General.   DRAINS:  None.   COMPLICATIONS:  None.   DESCRIPTION OF PROCEDURE:  The patient was prepped and draped in the dorsal  lithotomy position, after institution of an adequate level of general  anesthesia.  The initial fluoroscopic films were somewhat poor for fine  detail, mostly related to the patient's body habitus (295 pounds).  For that  reason, right retrograde was performed.  This showed normal course and  caliber of the ureter, pelvis and calyces.  Careful examination of the left  orifice showed bullous edema and erythema.  A floppy-tipped guide wire was  inserted at the left ureteral orifice, appeared to bypass a small calcific  density in the left hemipelvis.  End-hole catheter was advanced over the  guide wire; left in place for five minutes and the withdrawn.  The short 6-  French ureteroscope was then inserted into the left distal ureter.  A  collection of 3-4 mm khaki-colored stones were identified, negotiated into  the flat wire basket and then withdrawn.  Ureteroscope was reinserted to the  limit of the short 6-French scope.  No additional calculi were identified in  the distal two-thirds of the ureter.  Previous CT scan had shown no  clinically significant calculi within the left kidney.  There was clear  efflux from the left orifice, and no evidence of obstruction.  No double-J  stent was left indwelling. The bladder was drained.  The cystoscope was  removed.  The patient was given a B&O suppository and returned to recovery  room in satisfactory condition.                                               Boston Service, M.D.    RH/MEDQ  D:  07/19/2003  T:  07/19/2003  Job:  [id number]   cc:   [facility] Ranelle Oyster, PA

## 2011-03-21 NOTE — Discharge Summary (Signed)
[facility]  Patient:    Ariel Stewart, Ariel Stewart                     MRN: [id number] Adm. Date:  [id number] Disc. Date: [id number] Attending:  Lars Pinks Dictator:   Leilani Able, P.A.                           Discharge Summary  FINAL DIAGNOSES:              1. Intrauterine pregnancy at [redacted] weeks gestation.                               2. Insulin-dependent gestational diabetes.                               3. Failed induction of labor.                               4. Nonreassuring fetal heart rate tracing.  PROCEDURE:                    Primary low transverse cesarean section.  SURGEON:                      Miguel Aschoff, M.D.  COMPLICATIONS:                None.  HISTORY OF PRESENT ILLNESS:   This 45 year old, gravida 2, para 0-0-1-0, presents on March 02, 2000, for induction.  The patients prenatal course has been complicated by insulin-dependent gestational diabetes starting around [redacted] weeks gestation.  The patient has been well controlled on her insulin.  The patient was admitted for induction.  HOSPITAL COURSE:              She received Cytotec and Pitocin.  After about 24  hours, the patient had no change in dilation and effacement, was still -3 station. The patient at this point developed the onset of repetitive variable decellerations.  In view of the unchanged cervix with adequate labor and a nonreassuring fetal heart rate tracing, it was discussed with the patient to proceed with a primary low transverse cesarean section.  The patient was taken o the operating room by Miguel Aschoff, M.D. on Mar 03, 2000, where a primary low transverse cesarean section was performed with the delivery of an 8 pound 7 ounce female infant with Apgars of 9 and 9.  The delivery went without complications. he patients postoperative course was benign without significant fevers.  The patient was felt ready for discharge on postoperative day #3.  The  patient was sent home on a regular diet, told to decrease activity, was given Tylox one to two every four hours as needed for pain.  She was also given Motrin 600 mg one every six hours as needed for pain.  She was continued on Keflex 500 mg one q.i.d.  She was told to continue prenatal vitamins and follow up in the office in four weeks. DD:  03/23/00 TD:  03/24/00 Job: [id number]

## 2011-03-21 NOTE — Op Note (Signed)
[facility]  Patient:    Ariel Stewart, Ariel Stewart                     MRN: [id number] Proc. Date: 03/03/00 Attending:  Osborn Coho                           Operative Report  PREOPERATIVE DIAGNOSIS:       Intrauterine pregnancy at 39 weeks. Insulin-dependent gestational diabetes.  Failed induction of labor. Nonreassuring fetal heart rate tracing.  POSTOPERATIVE DIAGNOSIS:      Intrauterine pregnancy at 39 weeks. Insulin-dependent gestational diabetes.  Failed induction of labor. Nonreassuring fetal heart rate tracing.  With viable female infant, Apgars 8 and 9.  Nuchal cord x 1.  OPERATION:                    Primary low transverse cesarean section.  SURGEON:                      Miguel Aschoff, M.D.  ASSISTANT:  ANESTHESIA:                   Spinal.  COMPLICATIONS:                None.  ESTIMATED BLOOD LOSS:  INDICATIONS:                  The patient is a 45 year old white female, gravida 2, para 0-0-1-0, with an estimated date of confinement of Mar 10, 2000.  The patient has had a pregnancy complicated by gestational diabetes requiring insulin for control.  The patient was admitted for induction of labor and received Cytotec nd Pitocin after 24 hours, had essentially no dilatation and no effacement, -3 presenting vertex infant.  The patient developed the onset of repetitive variable decellerations and in view of the essentially unchanged cervix and now nonreassuring fetal heart rate tracing, it was elected to proceed with a primary cesarean section at this time.  The risks and benefits of the procedure were discussed with the patient and informed consent has been obtained.  DESCRIPTION OF PROCEDURE:     The patient was taken to the operating room and placed in the sitting position, and spinal anesthesia was administered without difficulty.  She was then placed in the supine position and prepped and draped n the usual sterile fashion.   Foley catheter was inserted.  At this point a Pfannenstiel incision was made and extended down through the subcutaneous tissue with bleeding points being clamped and coagulated as they were encountered. The fascia was then identified and incised transversely.  It was separated from the  underlying rectus muscles.  The rectus muscles were divided in the midline. The peritoneum was then identified and entered carefully avoiding underlying structures.  The peritoneal incision was then extended under direct visualization. A bladder flap was created and protected with the bladder blade.  An elliptical  transverse incision was made into the lower uterine segment.  The amniotic cavity was entered.  Clear fluid was obtained in copious amounts.  Nuchal cord x 1 was  noted and then with the assistance of a vacuum extractor, the patient was delivered of a viable female infant, Apgars 8 at one minute and 9 at five minutes from a vertex LOA position.  The baby was handed to the pediatric team in attendance.  Cord bloods were obtained for appropriate studies  and the placenta was delivered without difficulty.  At this point, the uterus was evacuated of any remaining products f conception.  The angles of the uterine incision were then ligated using figure-of-eight sutures of #1 Vicryl.  Then the uterus was closed in layers. The first layer was a running interlocking suture of #1 Vicryl followed by an imbricating suture of #1 Vicryl.  After this was done, the bladder flap was reapproximated using running continuous 2-0 Vicryl suture.  The tubes and ovaries were inspected and noted to be within normal limits.  With good hemostasis, lap  counts were then taken and found to be correct.  The abdomen was closed.  The parietoperitoneum was closed using running continuous 2-0 Vicryl suture.  The rectus muscles were reapproximated using running continuous 2-0 Vicryl suture. The fascia was then closed  using running continuous 0 Vicryl suture each starting at the lateral fascial angles and meeting in the midline.  The subcutaneous tissue was closed using interrupted 0 Vicryl suture and the skin incision was closed using  staples.  The estimated blood loss from the procedure was approximately 800 cc.  The patient tolerated the procedure well and went to the recovery room in satisfactory condition.  The estimated blood loss was approximately 800 cc. DD:  03/03/00 TD:  03/05/00 Job: [id number]

## 2011-03-21 NOTE — Op Note (Signed)
[facility]  Patient:    Ariel Stewart, Ariel Stewart Visit Number: [id number] MRN: [id number]          Service Type: OBS Location: 910A 9142 01 Attending Physician:  Osborn Coho Proc. Date: 07/07/01 Admit Date:  07/07/2001                             Operative Report  PREOPERATIVE DIAGNOSIS:       Intrauterine pregnancy at 39-5/7 weeks. History of prior cesarean section.  Refuses attempt at vaginal birth.  The patient desires permanent sterilization.  Breech presentation.  POSTOPERATIVE DIAGNOSIS:      Intrauterine pregnancy at 39-5/7 weeks. History of prior cesarean section.  Refuses attempt at vaginal birth.  The patient desires permanent sterilization.  Breech presentation.  OPERATION:                    Repeat low transverse cesarean section. Bilateral tubal ligation, Pomeroy procedure.  SURGEON:                      Mark E. Dareen Piano, M.D.  ASSISTANT:                    Caralyn Guile. Arlyce Dice, M.D.  ANESTHESIA:                   Spinal.  ANTIBIOTICS:                  Ancef 1 gram.  DRAINS:                       Foley to bedside drainage.  ESTIMATED BLOOD LOSS:         900 cc.  SPECIMENS:                    Portion of right and left fallopian tubes sent to pathology.  COMPLICATIONS:                None.  FINDINGS:                     The patient had normal appearing fallopian tubes, ovaries, and uterus.  The placenta appeared to be normal.  The infant was delivered in the breech presentation.  DESCRIPTION OF PROCEDURE:     The patient was taken to the operating room where a spinal anesthetic was administered without complications.  She was then placed in the dorsal supine position with a left lateral tilt.  The patient was then prepped with Hibiclens and Foley catheter placed.  She was draped in the usual fashion for this procedure.  A Pfannenstiel incision was made through the previous scar.  This was taken down to the fascia.   The fascia was entered in the midline and extended laterally with Mayo scissors. The rectus muscles were then separated from the fascia with a Bovie.  Rectus muscles were divided in the midline and taken superiorly and inferiorly.  The parietoperitoneum was entered sharply and taken superiorly and inferiorly. The bladder flap was taken down sharply.  A low transverse uterine incision was made in the midline and extended laterally with blunt dissection.  The amniotic sac was entered and the fluid noted to be clear.  The infant was delivered in the breech presentation.  On delivery of the head, the oropharynx and nostrils were bulb suctioned.  The  cord was then doubly clamped and cut and the infant handed to the awaiting NICU team.  Cord blood was then obtained. The placenta was then manually removed.  The uterus was exteriorized. The uterine cavity was wiped with a wet lap.  The uterine incision was closed in a single layer of 0 chromic in a running locking fashion.  The bladder flap was not closed.  The right fallopian tube was grasped with a Babcock in the isthmic portion.  A 2 cm knuckle was doubly ligated with 0 gut and the knuckle was excised.  Both ostia were visualized. Hemostasis appeared to be adequate.  A similar procedure was performed on the opposite side.  The uterus was then placed back into the abdominal cavity. Hemostasis again was checked and found to be adequate.  The parietoperitoneum was closed using 3-0 chromic in a running fashion and the muscles were reapproximated in the midline using 3-0 chromic in a running fashion.  The fascia was closed using 0 Monocryl in a running fashion.  Subcuticular tissue was made hemostatic with the Bovie.  The subcuticular tissue was closed with interrupted 2-0 gut suture.  The skin incision was closed with 4-0 Vicryl in the subcuticular stitch.  The patient tolerated the procedure well and she was taken to the recovery room in stable  condition.  Sponge, needle, and instrument counts were correct x 2. Attending Physician:  Osborn Coho DD:  07/07/01 TD:  07/07/01 Job: [id number]

## 2011-06-25 ENCOUNTER — Ambulatory Visit: Payer: 59 | Attending: Gynecologic Oncology | Admitting: Gynecologic Oncology

## 2011-06-25 DIAGNOSIS — D071 Carcinoma in situ of vulva: Secondary | ICD-10-CM | POA: Insufficient documentation

## 2011-06-27 ENCOUNTER — Other Ambulatory Visit: Payer: Self-pay | Admitting: Obstetrics and Gynecology

## 2011-06-27 DIAGNOSIS — R928 Other abnormal and inconclusive findings on diagnostic imaging of breast: Secondary | ICD-10-CM

## 2011-07-02 NOTE — Consult Note (Signed)
NAME:  Ariel Stewart, Ariel Stewart NO.:  [id number]  MEDICAL RECORD NO.:  [id number]  LOCATION:  GYN                          FACILITY:  [facility]  PHYSICIAN:  Elleanor Guyett A. Duard Brady, MD    DATE OF BIRTH:  11/20/1965  DATE OF CONSULTATION:  06/25/2011 DATE OF DISCHARGE:                                CONSULTATION   HISTORY OF PRESENT ILLNESS:  Ariel Stewart is a very pleasant 45 year old with a stage I-B vulvar carcinoma.  She saw Dr. Dareen Piano initially in May 2011.  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  PET CT revealed no abnormal intake in the vulva.  However, there was one external iliac node measuring 1.2 cm that did not have any uptake.  She was referred to [place] and subsequently August 2011 she underwent repeat wide local excision and bilateral groin node dissection.  There was a 2 cm left groin node that was slightly enlarged but soft and pathology was negative on the right of 0/5 lymph nodes and 0/2 on the left.  The vulva revealed focal high-grade dysplasia extending to the margins but there was no residual invasive carcinoma.  I last saw her in March 2012 at which time her exam was negative as was her Pap smear.  She comes in today for followup.  She has lost 14 pounds since her last visit.  She states in part she has been trying but that has also been due to stress.  At her last visit, she reported that her husband had lost his job which was quite stressful.  He subsequently has found new employment but there is some stress with the adjustment.  She herself is enjoying her job very much.  The only issue she has noticed is that on her right side at the inner edge of the vagina, she notices a nodule. On the right she has had a recent flare with an abscess that the patient has a diagnosis of  hidradenitis suppurativa.  It came up about 2-1/2 weeks ago and subsequently drained itself but there is still "hard knot."  She otherwise denies any complaints, any change in bowel or bladder habits, any abdominal or pelvic pain, nausea, vomiting, fevers or chills.  REVIEW OF SYSTEMS:  Ten-point review of systems is negative.  PHYSICAL EXAMINATION:  VITAL SIGNS:  Weight 246 pounds which is down 14 pounds, blood pressure 120/80, respirations 16, pulse 64, temperature 98. GENERAL:  Well-nourished, well-developed female in no acute distress. NECK:  Supple with no lymphadenopathy, no thyromegaly. LUNGS:  Clear to auscultation bilaterally. CARDIOVASCULAR:  Regular rate and rhythm. ABDOMEN:  Obese, soft, nontender, nondistended.  There are no palpable masses or hepatosplenomegaly.  Groins are negative for adenopathy. PELVIC:  External genitalia is notable for evidence of a surgical excision.  On her right side, there is evidence of a 1 x 2 cm hard knot. There is a punctate opening.  There is no purulence.  The area is not fluctuant.  It is not erythematous.  She does have diffuse scarring  consistent with hidradenitis suppurativa.  Just the inside of the introitus on her right side, she does have approximately a 3 mm skin tag.  There is no other hyperkeratotic or raised lesions.  Groins are negative for adenopathy.  ASSESSMENT:  45 year old with stage I-B vulvar carcinoma who has no evidence of recurrent disease 1 year out from her re-excision in bilateral groins.  PLAN:  Return to see [place] in 4 months.     Ariel Stewart A. Duard Brady, MD     PAG/MEDQ  D:  06/25/2011  T:  06/25/2011  Job:  [id number]  cc:   Malva Limes, M.D. Fax: [phone]  Telford Nab, R.N. [address] [place], [place] [address]  [place] Daphine Deutscher, MD [address], [address] [place] [address]  Primus Bravo, NP Fax: [phone]  Electronically Signed by Cleda Mccreedy MD on 07/02/2011 01:45:12 PM

## 2011-07-03 ENCOUNTER — Ambulatory Visit
Admission: RE | Admit: 2011-07-03 | Discharge: 2011-07-03 | Disposition: A | Discharge status: Home / Self Care | Payer: 59 | Source: Ambulatory Visit | Attending: Obstetrics and Gynecology | Admitting: Obstetrics and Gynecology

## 2011-07-03 DIAGNOSIS — R928 Other abnormal and inconclusive findings on diagnostic imaging of breast: Secondary | ICD-10-CM

## 2011-07-29 LAB — CBC
HCT: 41.6
Hemoglobin: 11.9 — ABNORMAL LOW
Hemoglobin: 13.2
MCHC: 33.4
MCV: 87.6
MCV: 88
Platelets: 378
RBC: 4.03
RBC: 4.42
RBC: 4.75
RDW: 14.7
RDW: 15.2
WBC: 8.1

## 2011-07-29 LAB — DIFFERENTIAL
Basophils Absolute: 0
Basophils Absolute: 0
Basophils Relative: 0
Basophils Relative: 0
Eosinophils Absolute: 0.1
Eosinophils Absolute: 0.1
Eosinophils Relative: 1
Lymphocytes Relative: 27
Lymphocytes Relative: 18
Lymphs Abs: 1.5
Monocytes Absolute: 0.5
Monocytes Absolute: 0.5
Monocytes Relative: 6
Monocytes Relative: 7
Neutro Abs: 7
Neutro Abs: 4.9
Neutrophils Relative %: 68
Neutrophils Relative %: 68

## 2011-07-29 LAB — BASIC METABOLIC PANEL
BUN: 9
BUN: 10
BUN: 11
CO2: 22
Calcium: 9.1
Calcium: 8.7
Chloride: 102
Chloride: 105
Creatinine, Ser: 0.54
Creatinine, Ser: 0.63
GFR calc Af Amer: >60
GFR calc Af Amer: >60
GFR calc non Af Amer: >60
GFR calc non Af Amer: >60
Glucose, Bld: 166 — ABNORMAL HIGH
Potassium: 4.3
Sodium: 136

## 2011-07-29 LAB — HEMOGLOBIN AND HEMATOCRIT, BLOOD
HCT: 36.9
Hemoglobin: 12.3

## 2011-07-29 LAB — URINE MICROSCOPIC-ADD ON

## 2011-07-29 LAB — URINALYSIS, ROUTINE W REFLEX MICROSCOPIC
Glucose, UA: NEGATIVE
Ketones, ur: 15 — AB
Nitrite: NEGATIVE
Specific Gravity, Urine: 1.027
pH: 5.5

## 2011-08-04 LAB — BASIC METABOLIC PANEL
BUN: 8
Calcium: 9.2
GFR calc non Af Amer: >60
Glucose, Bld: 90
Sodium: 138

## 2011-08-04 LAB — POCT HEMOGLOBIN-HEMACUE: Hemoglobin: 13.6

## 2011-08-13 DIAGNOSIS — E119 Type 2 diabetes mellitus without complications: Secondary | ICD-10-CM | POA: Insufficient documentation

## 2011-08-14 LAB — URINE MICROSCOPIC-ADD ON

## 2011-08-14 LAB — URINALYSIS, ROUTINE W REFLEX MICROSCOPIC
Glucose, UA: NEGATIVE
pH: 5.5

## 2011-08-18 LAB — URINALYSIS, ROUTINE W REFLEX MICROSCOPIC
Bilirubin Urine: NEGATIVE
Glucose, UA: NEGATIVE
Ketones, ur: NEGATIVE
Protein, ur: NEGATIVE

## 2011-08-18 LAB — BASIC METABOLIC PANEL
CO2: 22
Calcium: 8.4
Chloride: 106
GFR calc Af Amer: >60
Potassium: 4.2
Sodium: 134 — ABNORMAL LOW

## 2011-08-18 LAB — URINE MICROSCOPIC-ADD ON

## 2011-08-25 ENCOUNTER — Telehealth (INDEPENDENT_AMBULATORY_CARE_PROVIDER_SITE_OTHER): Payer: Self-pay | Admitting: Surgery

## 2011-08-25 NOTE — Telephone Encounter (Signed)
Recall letter mailed to patient. Adv pt to call our office to schedule an appt for bariatric surgery f/u...cef °

## 2011-09-10 ENCOUNTER — Ambulatory Visit (INDEPENDENT_AMBULATORY_CARE_PROVIDER_SITE_OTHER): Payer: 59 | Admitting: Surgery

## 2011-09-10 ENCOUNTER — Encounter (INDEPENDENT_AMBULATORY_CARE_PROVIDER_SITE_OTHER): Payer: Self-pay | Admitting: Surgery

## 2011-09-10 VITALS — BP 128/88 | HR 68 | Temp 98.9°F | Resp 20 | Ht 65.5 in | Wt 244.2 lb

## 2011-09-10 DIAGNOSIS — Z9884 Bariatric surgery status: Secondary | ICD-10-CM

## 2011-09-10 NOTE — Patient Instructions (Signed)
Would use range of motion exercises for the left scapular pain.  Stay on a low protein diet. Follow up with me in 2 months.

## 2011-09-10 NOTE — Progress Notes (Signed)
Ariel Stewart 45 y.o.  Body mass index is 40.03 kg/(m^2).  I did a hiatal hernia repair on her at the time of her LAP-BAND. Since then her reflux has been well controlled except for occasional discomfort. She is noted this more with stress.  There is no problem list on file for this patient.   Allergies  Allergen Reactions  . Penicillin G     Rash    . Sulfa Antibiotics     Genital swelling     Past Surgical History  Procedure Date  . Tonsillectomy 1985  . Knee surgery [id number], 2010    scope   . Elbow surgery 2009    torn tendon   . Cesarean section 2001, 2002  . Laparoscopic gastric banding April 2009   . Melanoma excision February 2010   . Lithotripsy 2009-2010    x3   . Vulva surgery may 2011, August 2011    cancer    Primus Bravo, NP, NP No diagnosis found.  She is 3.6 years out from her LAP-BAND and hiatal hernia repair. She has lost 69 pounds or 37.5% of her excess body weight. Since I last saw her she has had vulvar cancer and her father died.  She has developed some left scapular pain which may be related to the wide excision melanoma that I did on her. There is a trigger point on the left side of the transverse melanoma excision site.   Today I filled her with 0.9cc to her band.  Will see her back in 2 months.    Matt B. Daphine Deutscher, MD, [facility] [phone] beeper [phone]  09/10/2011 4:29 PM

## 2011-10-21 ENCOUNTER — Encounter: Payer: Self-pay | Admitting: Gynecologic Oncology

## 2011-10-22 ENCOUNTER — Ambulatory Visit: Payer: 59 | Attending: Gynecologic Oncology | Admitting: Gynecologic Oncology

## 2011-10-22 ENCOUNTER — Encounter: Payer: Self-pay | Admitting: Gynecologic Oncology

## 2011-10-22 VITALS — BP 118/68 | HR 80 | Temp 98.1°F | Resp 20 | Ht 64.17 in | Wt 230.9 lb

## 2011-10-22 DIAGNOSIS — E785 Hyperlipidemia, unspecified: Secondary | ICD-10-CM | POA: Insufficient documentation

## 2011-10-22 DIAGNOSIS — J45909 Unspecified asthma, uncomplicated: Secondary | ICD-10-CM | POA: Insufficient documentation

## 2011-10-22 DIAGNOSIS — K219 Gastro-esophageal reflux disease without esophagitis: Secondary | ICD-10-CM | POA: Insufficient documentation

## 2011-10-22 DIAGNOSIS — M549 Dorsalgia, unspecified: Secondary | ICD-10-CM | POA: Insufficient documentation

## 2011-10-22 DIAGNOSIS — I10 Essential (primary) hypertension: Secondary | ICD-10-CM | POA: Insufficient documentation

## 2011-10-22 DIAGNOSIS — Z79899 Other long term (current) drug therapy: Secondary | ICD-10-CM | POA: Insufficient documentation

## 2011-10-22 DIAGNOSIS — C519 Malignant neoplasm of vulva, unspecified: Secondary | ICD-10-CM | POA: Insufficient documentation

## 2011-10-22 NOTE — Patient Instructions (Signed)
Return to see me in 4 months

## 2011-10-22 NOTE — Progress Notes (Signed)
Consult Note: Gyn-Onc  Ariel Stewart 45 y.o. female  CC:  Chief Complaint  Patient presents with  . Vulvar Cancer    ZOX:WRUEAVW OF PRESENT ILLNESS: Ms. Grimes is a very pleasant 45 year old  with a stage I-B vulvar carcinoma. She saw Dr. Dareen Piano initially in  May 2011. 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 PET CT revealed no abnormal intake in  the vulva. However, there was one external iliac node measuring 1.2 cm that did not have any uptake. She was referred to [place] and subsequently  August 2011 she underwent repeat wide local excision and bilateral groin node dissection. There was a 2 cm left groin node that was slightly  enlarged but soft and pathology was negative on the right of 0/5 lymph nodes and 0/2 on the left. The vulva revealed focal high-grade  dysplasia extending to the margins but there was no residual invasive carcinoma. I last saw her in September 2012 at which time her exam was  Negative. Her Pap smear was negative in 3/12.  She comes in today for followup. At her last visit, she reported that her husband had  lost his job which was quite stressful. He subsequently has found new  employment but there is some stress with the adjustment. She herself is enjoying her job very much. The only issue she has noticed is that on her right side at the inner edge of the vagina, she notices a nodule. On the right she has had a recent flare with an abscess that the patient  has a diagnosis of hidradenitis suppurativa. It came up about 2-1/2 weeks ago and subsequently drained itself but there is still "hard  knot." She otherwise denies any complaints, any change in bowel or bladder habits, any abdominal or pelvic pain, nausea, vomiting, fevers  or chills.   REVIEW OF SYSTEMS: Ten-point review of  systems is negative.  She has lost approximately 15 pounds since we last saw her. She had her [place] up about a month ago. She is also walking more about a mile to 2 per day. With her walking more and losing weight she feels that the pain is on the anterior abdominal wall causing her to have some low back pain. We did discuss this today and she will start wearing binder or girdle to see if this alleviates her back pain. However, as she loses weight she may need to consider resection of the pannus for relief of her symptoms.  She does also have some bilateral lower quadrant pain that she feels is related to her ovaries. She denies any abnormal vulvar bleeding she has not had any more boils on her vulva.  She's otherwise doing very well.   Interval History: As above  Review of Systems: As above  Current Meds:  Outpatient Encounter Prescriptions as of 10/22/2011  Medication Sig Dispense Refill  . ALPRAZolam (XANAX) 1 MG tablet       . omeprazole (PRILOSEC) 20 MG capsule       . RELPAX 40 MG tablet One tablet by mouth as needed for migraine headache.  If the headache improves and then returns, dose may be repeated after 2 hours have elapsed since first dose (do not exceed 80 mg per day).      . rosuvastatin (CRESTOR) 20 MG tablet Take  20 mg by mouth daily.        . VENTOLIN HFA 108 (90 BASE) MCG/ACT inhaler as needed.         Allergy:  Allergies  Allergen Reactions  . Penicillin G     Rash    . Sulfa Antibiotics     Genital swelling     Social Hx:   History   Social History  . Marital Status: Married    Spouse Name: N/A    Number of Children: N/A  . Years of Education: N/A   Occupational History  . Not on file.   Social History Main Topics  . Smoking status: Current Everyday Smoker -- 0.5 packs/day  . Smokeless tobacco: Never Used  . Alcohol Use: Yes  . Drug Use: No  . Sexually Active:    Other Topics Concern  . Not on file   Social History Narrative  . No narrative on  file    Past Surgical Hx:  Past Surgical History  Procedure Date  . Tonsillectomy 1985  . Knee surgery [id number], 2010    scope   . Elbow surgery 2009    torn tendon   . Cesarean section 2001, 2002  . Laparoscopic gastric banding April 2009   . Melanoma excision February 2010   . Lithotripsy 2009-2010    x3   . Vulva surgery may 2011, August 2011    cancer     Past Medical Hx:  Past Medical History  Diagnosis Date  . Asthma   . Cancer   . GERD (gastroesophageal reflux disease)   . Diabetes mellitus   . Hypertension   . Hyperlipidemia     Family Hx: No family history on file.  Vitals:  There were no vitals taken for this visit.  Physical Exam: Well-nourished well-developed female in no acute distress.  Neck: No lymphadenopathy no thyromegaly.  Lungs: Clear to auscultation bilaterally.  Cardiovascular: Regular rate and rhythm.  Abdomen: Soft nontender nondistended there are no palpable masses or hepatosplenomegaly. Exam is limited by habitus. There is a prominent pannus overhanging the mons.  Groins: No lymphadenopathy  Pelvic: External genitalia is notable for surgical excision. There is evidence of hidradenitis scarring. She has external hemorrhoids. There are no new vulvar lesions. There is no hyperkeratosis.  Assessment/Plan: 45 year old with history of stage IB vulvar carcinoma who clinically has no evidence of recurrent disease. She will return to see [place] in 4 months.  She will where an abdominal binder to see this alleviates her back pain.  I will repeat her Pap smear which she comes to see [place] in April.   Tifany Hirsch A., MD 10/22/2011, 8:09 AM

## 2011-10-23 ENCOUNTER — Encounter (INDEPENDENT_AMBULATORY_CARE_PROVIDER_SITE_OTHER): Payer: Self-pay | Admitting: Surgery

## 2011-10-23 ENCOUNTER — Ambulatory Visit (INDEPENDENT_AMBULATORY_CARE_PROVIDER_SITE_OTHER): Payer: 59 | Admitting: Surgery

## 2011-10-23 DIAGNOSIS — C4359 Malignant melanoma of other part of trunk: Secondary | ICD-10-CM

## 2011-10-23 DIAGNOSIS — E669 Obesity, unspecified: Secondary | ICD-10-CM

## 2011-10-23 NOTE — Progress Notes (Signed)
Ariel Stewart comes in today and she is 3.7 years out from her lapband APS with hiatal hernia repair. She has lost 81 pounds or 45% of her excess weight. Her BMI is fallen from 50-37.3.  She's not had any appreciable reflux after her hiatal hernia repair. She's been having some back pain and more likely from ergonomic some working at the [employer] were all day.  We'll see her back in 2 months

## 2011-11-27 ENCOUNTER — Encounter (INDEPENDENT_AMBULATORY_CARE_PROVIDER_SITE_OTHER): Payer: Self-pay | Admitting: Surgery

## 2012-01-29 ENCOUNTER — Ambulatory Visit (INDEPENDENT_AMBULATORY_CARE_PROVIDER_SITE_OTHER): Payer: 59 | Admitting: Surgery

## 2012-01-29 ENCOUNTER — Encounter (INDEPENDENT_AMBULATORY_CARE_PROVIDER_SITE_OTHER): Payer: Self-pay | Admitting: Surgery

## 2012-01-29 VITALS — BP 126/82 | HR 86 | Ht 66.0 in | Wt 213.4 lb

## 2012-01-29 DIAGNOSIS — C4359 Malignant melanoma of other part of trunk: Secondary | ICD-10-CM

## 2012-01-29 NOTE — Progress Notes (Signed)
Ariel Stewart 46 y.o.  Body mass index is 34.44 kg/(m^2).  Patient Active Problem List  Diagnoses  . Vulvar cancer  . Obesity-preop weight 314  . 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 Penicillin G     Rash    . Sulfa Antibiotics     Genital swelling     Past Surgical History  Procedure Date  . Tonsillectomy 1985  . Knee surgery [id number], 2010    scope   . Elbow surgery 2009    torn tendon   . Cesarean section 2001, 2002  . Laparoscopic gastric banding April 2009   . Melanoma excision February 2010   . Lithotripsy 2009-2010    x3   . Vulva surgery may 2011, August 2011    cancer    Primus Bravo, NP, NP No diagnosis found.  Had Norwalk virus about 3 weeks ago and was seen at [facility] in [place].  CT scan showed lapband ok.  Had workup of kidney cysts per [facility].   No fill needed.  Occasional GER Return prn Matt B. Daphine Deutscher, MD, [facility] [phone] beeper [phone]  01/29/2012 10:13 AM

## 2012-02-25 ENCOUNTER — Other Ambulatory Visit (HOSPITAL_COMMUNITY)
Admission: RE | Admit: 2012-02-25 | Discharge: 2012-02-25 | Disposition: A | Discharge status: Home / Self Care | Payer: 59 | Source: Ambulatory Visit | Attending: Gynecologic Oncology | Admitting: Gynecologic Oncology

## 2012-02-25 ENCOUNTER — Ambulatory Visit: Payer: 59 | Attending: Gynecologic Oncology | Admitting: Gynecologic Oncology

## 2012-02-25 ENCOUNTER — Encounter: Payer: Self-pay | Admitting: Gynecologic Oncology

## 2012-02-25 VITALS — BP 106/60 | HR 70 | Temp 98.3°F | Resp 16 | Ht 64.0 in | Wt 206.0 lb

## 2012-02-25 DIAGNOSIS — K219 Gastro-esophageal reflux disease without esophagitis: Secondary | ICD-10-CM | POA: Insufficient documentation

## 2012-02-25 DIAGNOSIS — I10 Essential (primary) hypertension: Secondary | ICD-10-CM | POA: Insufficient documentation

## 2012-02-25 DIAGNOSIS — Z01419 Encounter for gynecological examination (general) (routine) without abnormal findings: Secondary | ICD-10-CM | POA: Insufficient documentation

## 2012-02-25 DIAGNOSIS — Z79899 Other long term (current) drug therapy: Secondary | ICD-10-CM | POA: Insufficient documentation

## 2012-02-25 DIAGNOSIS — C519 Malignant neoplasm of vulva, unspecified: Secondary | ICD-10-CM

## 2012-02-25 DIAGNOSIS — E785 Hyperlipidemia, unspecified: Secondary | ICD-10-CM | POA: Insufficient documentation

## 2012-02-25 DIAGNOSIS — J45909 Unspecified asthma, uncomplicated: Secondary | ICD-10-CM | POA: Insufficient documentation

## 2012-02-25 DIAGNOSIS — E119 Type 2 diabetes mellitus without complications: Secondary | ICD-10-CM | POA: Insufficient documentation

## 2012-02-25 NOTE — Patient Instructions (Signed)
Return to clinic in 6 months.

## 2012-02-25 NOTE — Progress Notes (Signed)
Consult Note: Gyn-Onc  Ariel Stewart 46 y.o. female  CC:  Chief Complaint  Patient presents with  . Vulvar Cancer    Follow up    HPI: Ariel Stewart is a very pleasant 46 year old with a stage I-B vulvar carcinoma. She saw Dr. Dareen Piano initially in May 2011. 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 PET CT revealed no abnormal intake in the vulva. However, there was one external iliac node measuring 1.2 cm that did not have any uptake. She was referred to [place] and subsequently August 2011 she underwent repeat wide local excision and bilateral groin node dissection. There was a 2 cm left groin node that was slightly enlarged but soft and pathology was negative on the right of 0/5 lymph nodes and 0/2 on the left. The vulva revealed focal high-grade dysplasia extending to the margins but there was no residual invasive carcinoma. I last saw her in Dec. 2012 at which time her exam was Negative. Her Pap smear was negative in 3/12.  She comes in today for followup. At her last visit, she reported that her husband had lost his job which was quite stressful. He subsequently has found new employment but there is some stress with the adjustment. She herself is enjoying her job very much.   She denies any complaints, any change in bowel or bladder habits, any abdominal or pelvic pain, nausea, vomiting, fevers or chills.   REVIEW OF SYSTEMS: Ten-point review of systems is negative. She has lost approximately 24 pounds since we last saw her. She had her [place] up about a month ago. She is also walking more about a mile to 2 per day. With her walking more and losing weight she feels that the pain is on the anterior abdominal wall causing her to have some low back pain. We did discuss this today and she will start wearing binder or  girdle to see if this alleviates her back pain. However, as she loses weight she may need to consider resection of the pannus for relief of her symptoms. She does also have some bilateral lower quadrant pain that she feels is related to her ovaries. She denies any abnormal vulvar bleeding she has not had any more boils on her vulva. She's otherwise doing very well   Current Meds:  Outpatient Encounter Prescriptions as of 02/25/2012  Medication Sig Dispense Refill  . ALPRAZolam (XANAX) 1 MG tablet       . citalopram (CELEXA) 40 MG tablet Take 40 mg by mouth daily.      Marland Kitchen omeprazole (PRILOSEC) 20 MG capsule       . RELPAX 40 MG tablet One tablet by mouth as needed for migraine headache.  If the headache improves and then returns, dose may be repeated after 2 hours have elapsed since first dose (do not exceed 80 mg per day).      . rosuvastatin (CRESTOR) 20 MG tablet Take 20 mg by mouth daily.        . VENTOLIN HFA 108 (90 BASE) MCG/ACT inhaler as needed.         Allergy:  Allergies  Allergen Reactions  . Penicillin G     Rash    . Sulfa Antibiotics     Genital swelling     Social Hx:   History   Social History  .  Marital Status: Married    Spouse Name: N/A    Number of Children: N/A  . Years of Education: N/A   Occupational History  . Not on file.   Social History Main Topics  . Smoking status: Current Everyday Smoker -- 0.5 packs/day  . Smokeless tobacco: Never Used  . Alcohol Use: Yes  . Drug Use: No  . Sexually Active:    Other Topics Concern  . Not on file   Social History Narrative  . No narrative on file    Past Surgical Hx:  Past Surgical History  Procedure Date  . Tonsillectomy 1985  . Knee surgery [id number], 2010    scope   . Elbow surgery 2009    torn tendon   . Cesarean section 2001, 2002  . Laparoscopic gastric banding April 2009   . Melanoma excision February 2010   . Lithotripsy 2009-2010    x3   . Vulva surgery may 2011, August 2011     cancer     Past Medical Hx:  Past Medical History  Diagnosis Date  . Asthma   . Cancer   . GERD (gastroesophageal reflux disease)   . Diabetes mellitus   . Hypertension   . Hyperlipidemia     Family Hx: No family history on file.  Vitals:  Blood pressure 106/60, pulse 70, temperature 98.3 F (36.8 C), temperature source Oral, resp. rate 16, height 5\' 4"  (1.626 m), weight 206 lb (93.441 kg).  Physical Exam: Well-nourished well-developed female in no acute distress.  Neck: No lymphadenopathy no thyromegaly.  Lungs: Clear to auscultation bilaterally.  Cardiovascular: Regular rate and rhythm.  Abdomen: Soft nontender nondistended there are no palpable masses or hepatosplenomegaly. Exam is limited by habitus. There is a prominent pannus overhanging the mons.  Groins: No lymphadenopathy  Pelvic: External genitalia is notable for surgical excision. There is evidence of hidradenitis scarring. She has external hemorrhoids. There are no new vulvar lesions. There is no hyperkeratosis. The cervix without lesions. ThinPrep Pap smear was admitted without difficulty. Bimanual examination the cervix is palpably normal. The uterus is of normal size shape and consistency. There are no adnexal masses  46 year old with history of stage IB vulvar carcinoma who clinically has no evidence of recurrent disease. She will return to see [place] in 4 months. She will where an abdominal binder to see this alleviates her back pain. I will repeat her Pap smear which she comes to see [place] in April.  Assessment/Plan:46 year old with history of stage IB vulvar carcinoma who clinically has no evidence of recurrent disease. She will return to see [place] in 6 months. We will notify her of the results of her Pap smear.   Zyen Triggs A., MD 02/25/2012, 8:36 AM

## 2012-03-01 ENCOUNTER — Telehealth: Payer: Self-pay | Admitting: Gynecologic Oncology

## 2012-03-01 NOTE — Telephone Encounter (Signed)
Message left for patient with pap smear results: negative.  Instructed to call for any questions or concerns.  

## 2012-06-30 ENCOUNTER — Ambulatory Visit (INDEPENDENT_AMBULATORY_CARE_PROVIDER_SITE_OTHER): Payer: 59 | Admitting: Surgery

## 2012-06-30 ENCOUNTER — Encounter (INDEPENDENT_AMBULATORY_CARE_PROVIDER_SITE_OTHER): Payer: Self-pay | Admitting: Surgery

## 2012-06-30 VITALS — BP 114/70 | HR 88 | Temp 97.0°F | Resp 16 | Ht 66.0 in | Wt 209.2 lb

## 2012-06-30 DIAGNOSIS — Z9884 Bariatric surgery status: Secondary | ICD-10-CM | POA: Insufficient documentation

## 2012-06-30 NOTE — Patient Instructions (Addendum)

## 2012-06-30 NOTE — Progress Notes (Signed)
Dorthy B Das Body mass index is 33.77 kg/(m^2).  Having regurgitation:  Occasional afer she had the Noravirus  Nocturnal reflux?  no  Amount of fill  0.5

## 2012-08-11 ENCOUNTER — Ambulatory Visit: Payer: 59 | Attending: Gynecologic Oncology | Admitting: Gynecologic Oncology

## 2012-08-11 ENCOUNTER — Encounter: Payer: Self-pay | Admitting: Gynecologic Oncology

## 2012-08-11 VITALS — BP 118/64 | HR 68 | Temp 97.7°F | Resp 16 | Ht 65.24 in | Wt 204.0 lb

## 2012-08-11 DIAGNOSIS — I10 Essential (primary) hypertension: Secondary | ICD-10-CM | POA: Insufficient documentation

## 2012-08-11 DIAGNOSIS — F17200 Nicotine dependence, unspecified, uncomplicated: Secondary | ICD-10-CM | POA: Insufficient documentation

## 2012-08-11 DIAGNOSIS — E785 Hyperlipidemia, unspecified: Secondary | ICD-10-CM | POA: Insufficient documentation

## 2012-08-11 DIAGNOSIS — K219 Gastro-esophageal reflux disease without esophagitis: Secondary | ICD-10-CM | POA: Insufficient documentation

## 2012-08-11 DIAGNOSIS — E119 Type 2 diabetes mellitus without complications: Secondary | ICD-10-CM | POA: Insufficient documentation

## 2012-08-11 DIAGNOSIS — C519 Malignant neoplasm of vulva, unspecified: Secondary | ICD-10-CM | POA: Insufficient documentation

## 2012-08-11 NOTE — Patient Instructions (Signed)
RTC 6 months

## 2012-08-11 NOTE — Progress Notes (Signed)
Consult Note: Gyn-Onc  Ariel Stewart 46 y.o. female  CC:  Chief Complaint  Patient presents with  . Vulvar Cancer    Follow up    HPI: Ariel Stewart is a very pleasant 46 year old with a stage I-B vulvar carcinoma. She saw Dr. Dareen Piano initially in May 2011. 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 PET CT revealed no abnormal intake in the vulva. However, there was one external iliac node measuring 1.2 cm that did not have any uptake. She was referred to [place] and subsequently August 2011 she underwent repeat wide local excision and bilateral groin node dissection. There was a 2 cm left groin node that was slightly enlarged but soft and pathology was negative on the right of 0/5 lymph nodes and 0/2 on the left. The vulva revealed focal high-grade dysplasia extending to the margins but there was no residual invasive carcinoma. I last saw her in Dec. 2012 at which time her exam was Negative. Her Pap smear was negative in 3/12.  She comes in today for followup. At her last visit, she reported that her husband had lost his job which was quite stressful. He subsequently has found new employment but there is some stress with the adjustment. She herself is enjoying her job very much.  She denies any complaints, any change in bowel or bladder habits, any abdominal or pelvic pain, nausea, vomiting, fevers or chills.   REVIEW OF SYSTEMS: Ten-point review of systems is negative. She has lost approximately 2 more pounds since we last saw her. She is also walking more about a mile to 2 per day.  However, as she loses weight she may need to consider resection of the pannus for relief of her symptoms. She has seen a Engineer, petroleum and may have surgery before we see her next. She does also have some bilateral lower quadrant pain that  she feels is related to her ovaries. She denies any abnormal vulvar bleeding she has not had any more boils on her vulva. She's otherwise doing very well. LMP today.  MMG due 11/13. Pap 4/13 was negative.  Last A1c was 6.2    Current Meds:  Outpatient Encounter Prescriptions as of 08/11/2012  Medication Sig Dispense Refill  . ALPRAZolam (XANAX) 1 MG tablet       . citalopram (CELEXA) 40 MG tablet Take 40 mg by mouth daily.      Marland Kitchen omeprazole (PRILOSEC) 20 MG capsule       . RELPAX 40 MG tablet One tablet by mouth as needed for migraine headache.  If the headache improves and then returns, dose may be repeated after 2 hours have elapsed since first dose (do not exceed 80 mg per day).      . rosuvastatin (CRESTOR) 20 MG tablet Take 20 mg by mouth daily.        . VENTOLIN HFA 108 (90 BASE) MCG/ACT inhaler as needed.         Allergy:  Allergies  Allergen Reactions  . Penicillin G     Rash    . Sulfa Antibiotics     Genital swelling     Social Hx:   History   Social History  . Marital Status: Married    Spouse Name: N/A    Number of Children: N/A  . Years of Education: N/A  Occupational History  . Not on file.   Social History Main Topics  . Smoking status: Current Every Day Smoker -- 0.5 packs/day  . Smokeless tobacco: Never Used   Comment: smoking information given  . Alcohol Use: Yes  . Drug Use: No  . Sexually Active: Yes   Other Topics Concern  . Not on file   Social History Narrative  . No narrative on file    Past Surgical Hx:  Past Surgical History  Procedure Date  . Tonsillectomy 1985  . Knee surgery [id number], 2010    scope   . Elbow surgery 2009    torn tendon   . Cesarean section 2001, 2002  . Laparoscopic gastric banding April 2009   . Melanoma excision February 2010   . Lithotripsy 2009-2010    x3   . Vulva surgery may 2011, August 2011    cancer     Past Medical Hx:  Past Medical History  Diagnosis Date  . Asthma   . Cancer   . GERD  (gastroesophageal reflux disease)   . Diabetes mellitus   . Hypertension   . Hyperlipidemia     Family Hx: No family history on file.  Vitals:  Blood pressure 118/64, pulse 68, temperature 97.7 F (36.5 C), temperature source Oral, resp. rate 16, height 5' 5.24" (1.657 m), weight 204 lb (92.534 kg), last menstrual period 08/07/2012.  Physical Exam: Well-nourished well-developed female in no acute distress.   Neck: No lymphadenopathy no thyromegaly.   Lungs: Clear to auscultation bilaterally.   Cardiovascular: Regular rate and rhythm.   Abdomen: Soft nontender nondistended there are no palpable masses or hepatosplenomegaly. Exam is limited by habitus. There is a prominent pannus overhanging the mons.   Groins: No lymphadenopathy   Pelvic: External genitalia is notable for surgical excision. There is evidence of hidradenitis scarring. She has external hemorrhoids. There are no new vulvar lesions. There is no hyperkeratosis. No palpable vulvar lesions, + menstrual flow.    Assessment/Plan:  46 year old with history of stage IB vulvar carcinoma who clinically has no evidence of recurrent disease. She will return to see [place] in 6 months.   Kathlyn Leachman A., MD 08/11/2012, 8:59 AM

## 2012-12-14 ENCOUNTER — Other Ambulatory Visit: Payer: Self-pay | Admitting: Obstetrics and Gynecology

## 2013-01-03 ENCOUNTER — Ambulatory Visit (INDEPENDENT_AMBULATORY_CARE_PROVIDER_SITE_OTHER): Payer: 59 | Admitting: Surgery

## 2013-01-03 ENCOUNTER — Encounter (INDEPENDENT_AMBULATORY_CARE_PROVIDER_SITE_OTHER): Payer: Self-pay | Admitting: Surgery

## 2013-01-03 ENCOUNTER — Telehealth (INDEPENDENT_AMBULATORY_CARE_PROVIDER_SITE_OTHER): Payer: Self-pay | Admitting: *Deleted

## 2013-01-03 VITALS — BP 118/78 | HR 72 | Resp 20 | Ht 66.0 in | Wt 187.0 lb

## 2013-01-03 DIAGNOSIS — Z9884 Bariatric surgery status: Secondary | ICD-10-CM

## 2013-01-03 NOTE — Progress Notes (Signed)
Lapband Fill Encounter Problem List:   Patient Active Problem List  Diagnosis  . Vulvar cancer  . Obesity-preop weight 314  . 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 Lapband APS + [facility] repair April 2009    Ariel Stewart Body mass index is 30.2 kg/(m^2). Weight loss since surgery  127 lbs  Having regurgitation?:  yes  Feel that they need a fill?  Unfilled x 5 cc  Nocturnal reflux?  yes  Amount of fill  -5 cc   Port site: Accessed without difficulty.  5 cc withdrawn.    Instructions given and weight loss goals discussed.  I believe that she may have a slight slip and I have given her a band vacation. As long as she is able to drink we'll let her go home and see her back in about 2 weeks. If she has any more difficulty during this period of time then I would obtain an upper GI series.    Matt B. Daphine Deutscher, MD, FACS

## 2013-01-03 NOTE — Patient Instructions (Signed)
Rehydrate with liquids for the next two days before restarting foods.

## 2013-01-03 NOTE — Telephone Encounter (Signed)
Patient called to state that she is having issues with filling very full then vomiting.  Patient states she has been seen by PMD and ED for rule out of cardiac issues.  Patient states she has lost 12lbs since 12/25/12.  Daphine Deutscher MD made aware and appt schedule for today for patient.  Patient agreeable at this time.

## 2013-01-21 ENCOUNTER — Ambulatory Visit (INDEPENDENT_AMBULATORY_CARE_PROVIDER_SITE_OTHER): Payer: 59 | Admitting: Surgery

## 2013-01-21 ENCOUNTER — Encounter (INDEPENDENT_AMBULATORY_CARE_PROVIDER_SITE_OTHER): Payer: Self-pay | Admitting: Surgery

## 2013-01-21 VITALS — BP 132/86 | HR 80 | Temp 97.6°F | Resp 16 | Ht 66.0 in | Wt 198.0 lb

## 2013-01-21 DIAGNOSIS — Z9884 Bariatric surgery status: Secondary | ICD-10-CM

## 2013-01-21 NOTE — Progress Notes (Signed)
Lapband Fill Encounter Problem List:   Patient Active Problem List  Diagnosis  . Vulvar cancer  . Obesity-preop weight 314  . 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 Lapband APS + [facility] repair April 2009    Ariel Stewart Body mass index is 31.97 kg/(m^2). Weight loss since surgery  116  Having regurgitation?:  No    Feel that they need a fill?  Was on a band vacation  Nocturnal reflux?  no  Amount of fill  ~2 cc   Port site: Accessed twice--once with 4 cc then I took off 1 in a tuberculin syringe and probably bled off another 1 cc down to 2 Recheck in 6 weeks.  Instructions given and weight loss goals discussed.    Matt B. Daphine Deutscher, MD, FACS

## 2013-01-21 NOTE — Patient Instructions (Signed)

## 2013-03-02 ENCOUNTER — Ambulatory Visit: Payer: 59 | Admitting: Gynecologic Oncology

## 2013-03-02 ENCOUNTER — Ambulatory Visit: Payer: 59 | Attending: Gynecologic Oncology | Admitting: Gynecologic Oncology

## 2013-03-02 ENCOUNTER — Encounter: Payer: Self-pay | Admitting: Gynecologic Oncology

## 2013-03-02 VITALS — BP 110/56 | HR 66 | Temp 98.6°F | Resp 20 | Ht 65.24 in | Wt 223.5 lb

## 2013-03-02 DIAGNOSIS — C519 Malignant neoplasm of vulva, unspecified: Secondary | ICD-10-CM

## 2013-03-02 NOTE — Patient Instructions (Signed)
Return to clinic as scheduled

## 2013-03-02 NOTE — Progress Notes (Signed)
Consult Note: Gyn-Onc  Ariel Stewart 47 y.o. female  CC:  Chief Complaint  Patient presents with  . VIN    Follow up    HPI: Ariel Stewart is a very pleasant 47 year old with a stage I-B vulvar carcinoma. Ariel Stewart saw Dr. Dareen Piano initially in May 2011. 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 PET CT revealed no abnormal intake in the vulva. However, there was one external iliac node measuring 1.2 cm that did not have any uptake. Ariel Stewart was referred to [place] and subsequently August 48 Ariel Stewart underwent repeat wide local excision and bilateral groin node dissection. There was a 2 cm left groin node that was slightly enlarged but soft and pathology was negative on the right of 0/5 lymph nodes and 0/2 on the left. The vulva revealed focal high-grade dysplasia extending to the margins but there was no residual invasive carcinoma.   I last saw Ariel Stewart in Oct. 2013 at which time Ariel Stewart exam was Negative. Ariel Stewart Pap smear was negative in 2/14 with Dr. Dareen Piano. Ariel Stewart had an IUD placed in 2/14 to decrease Ariel Stewart bleeding and it has decreased the amount of Ariel Stewart menstrual flow. This was in an effort to help with Ariel Stewart iron deficiency anemia. Ariel Stewart had both upper and lower endoscopy. On endoscopy found a benign polyp that was removed. Temporally related to Ariel Stewart upper endoscopy there was an obstruction of Ariel Stewart lap band and it had to be deflated. Dr. Daphine Deutscher attempted to reinflated and Ariel Stewart began having symptoms of nausea and vomiting again. Ariel Stewart sees him next week for reconsideration of inflating Ariel Stewart lap band again.  Ariel Stewart has gained about 20 pounds since February.  Ariel Stewart comes in today for followup.  Ariel Stewart herself is enjoying Ariel Stewart job very much. 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  Ariel Stewart denies any complaints, any change in bowel or  bladder habits, any abdominal or pelvic pain, nausea, vomiting, fevers or chills.   REVIEW OF SYSTEMS: Ten-point review of systems is negative. Ariel Stewart denies any abnormal vulvar bleeding Ariel Stewart has not had any more boils on Ariel Stewart vulva. Ariel Stewart's otherwise doing very well. LMP today.  MMG done 2/14. Pap 2/14 was negative.     Current Meds:  Outpatient Encounter Prescriptions as of 03/02/2013  Medication Sig Dispense Refill  . ALPRAZolam (XANAX) 1 MG tablet       . citalopram (CELEXA) 40 MG tablet Take 40 mg by mouth daily.      Marland Kitchen omeprazole (PRILOSEC) 20 MG capsule       . RELPAX 40 MG tablet One tablet by mouth as needed for migraine headache.  If the headache improves and then returns, dose may be repeated after 2 hours have elapsed since first dose (do not exceed 80 mg per day).      . rosuvastatin (CRESTOR) 20 MG tablet Take 20 mg by mouth daily.        . VENTOLIN HFA 108 (90 BASE) MCG/ACT inhaler as needed.        No facility-administered encounter medications on file as of 03/02/2013.    Allergy:  Allergies  Allergen Reactions  . Penicillin G     Rash    . Sulfa Antibiotics     Genital swelling     Social Hx:   History  Social History  . Marital Status: Married    Spouse Name: N/A    Number of Children: N/A  . Years of Education: N/A   Occupational History  . Not on file.   Social History Main Topics  . Smoking status: Current Every Day Smoker -- 0.50 packs/day  . Smokeless tobacco: Never Used     Comment: smoking information given  . Alcohol Use: Yes  . Drug Use: No  . Sexually Active: Yes   Other Topics Concern  . Not on file   Social History Narrative  . No narrative on file    Past Surgical Hx:  Past Surgical History  Procedure Laterality Date  . Tonsillectomy  1985  . Knee surgery  [id number], 2010    scope   . Elbow surgery  2009    torn tendon   . Cesarean section  2001, 2002  . Laparoscopic gastric banding  April 2009   . Melanoma excision  February  2010   . Lithotripsy  2009-2010    x3   . Vulva surgery  may 2011, August 2011    cancer     Past Medical Hx:  Past Medical History  Diagnosis Date  . Asthma   . GERD (gastroesophageal reflux disease)   . Diabetes mellitus   . Hypertension   . Hyperlipidemia   . 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 No pertinent family history.  Vitals:  Blood pressure 110/56, pulse 66, temperature 98.6 F (37 C), resp. rate 20, height 5' 5.24" (1.657 m), weight 223 lb 8 oz (101.379 kg).  Physical Exam: Well-nourished well-developed female in no acute distress.   Neck: No lymphadenopathy no thyromegaly.   Lungs: Clear to auscultation bilaterally.   Cardiovascular: Regular rate and rhythm.   Abdomen: Soft nontender nondistended there are no palpable masses or hepatosplenomegaly. Exam is limited by habitus. There is a prominent pannus overhanging the mons.   Groins: No lymphadenopathy   Pelvic: External genitalia is notable for surgical excision. There is evidence of hidradenitis scarring. Ariel Stewart has external hemorrhoids. There are no new vulvar lesions. There is no hyperkeratosis. No palpable vulvar lesions, + menstrual flow.    Assessment/Plan:  47 year old with history of stage IB vulvar carcinoma who clinically has no evidence of recurrent disease. Ariel Stewart will return to see [place] in 6 months. Ariel Stewart will followup with Ariel Stewart other physicians as scheduled.  Rashaud Ybarbo A., MD 03/02/2013, 8:55 AM

## 2013-03-09 ENCOUNTER — Encounter (INDEPENDENT_AMBULATORY_CARE_PROVIDER_SITE_OTHER): Payer: 59 | Admitting: Surgery

## 2013-04-07 ENCOUNTER — Encounter (INDEPENDENT_AMBULATORY_CARE_PROVIDER_SITE_OTHER): Payer: Self-pay

## 2013-04-07 ENCOUNTER — Ambulatory Visit (INDEPENDENT_AMBULATORY_CARE_PROVIDER_SITE_OTHER): Payer: 59 | Admitting: Physician Assistant

## 2013-04-07 ENCOUNTER — Encounter (INDEPENDENT_AMBULATORY_CARE_PROVIDER_SITE_OTHER): Payer: 59 | Admitting: Surgery

## 2013-04-07 VITALS — BP 124/78 | HR 75 | Temp 98.0°F | Resp 18 | Ht 65.0 in | Wt 235.0 lb

## 2013-04-07 DIAGNOSIS — Z4651 Encounter for fitting and adjustment of gastric lap band: Secondary | ICD-10-CM

## 2013-04-07 NOTE — Patient Instructions (Signed)
Take clear liquids tonight. Thin protein shakes are ok to start tomorrow morning. Slowly advance your diet thereafter. Call us if you have persistent vomiting or regurgitation, night cough or reflux symptoms. Return as scheduled or sooner if you notice no changes in hunger/portion sizes.  

## 2013-04-07 NOTE — Progress Notes (Signed)
  HISTORY: Ariel Stewart is a 47 y.o.female who received an AP-Standard lap-band in April 2009 by Dr. Daphine Deutscher. She had a lap-band holiday for obstruction in early march of this year. Dr. Daphine Deutscher removed 5 mL at that time. He attempted to return 4 mL a couple of weeks later but she was able to tolerate only 2 mL fill at that point. She comes in today with 37 lbs weight gain, hunger and larger than desired portion sizes, which would not be unexpected. She denies obstructive symptoms. She enthusiastically wants a fill today.  VITAL SIGNS: Filed Vitals:   04/07/13 1400  BP: 124/78  Pulse: 75  Temp: 98 F (36.7 C)  Resp: 18    PHYSICAL EXAM: Physical exam reveals a very well-appearing 47 y.o.female in no apparent distress Neurologic: Awake, alert, oriented Psych: Bright affect, conversant Respiratory: Breathing even and unlabored. No stridor or wheezing Abdomen: Soft, nontender, nondistended to palpation. Incisions well-healed. No incisional hernias. Port easily palpated. Extremities: Atraumatic, good range of motion.  ASSESMENT: 47 y.o.  female  s/p AP-Standard lap-band.   PLAN: The patient's port was accessed with a 20G Huber needle without difficulty. Clear fluid was aspirated and 1.5 mL saline was added to the port. The patient was able to swallow water without difficulty following the procedure and was instructed to take clear liquids for the next 24-48 hours and advance slowly as tolerated. I attempted to add 2 mL but there was a fair bit of backpressure on the syringe at that volume. I asked her to return in one month without fail to continue to monitor her weight loss and make an adjustment if needed. She voiced understanding and agreement.

## 2013-05-05 ENCOUNTER — Encounter (INDEPENDENT_AMBULATORY_CARE_PROVIDER_SITE_OTHER): Payer: 59

## 2013-05-12 ENCOUNTER — Encounter (INDEPENDENT_AMBULATORY_CARE_PROVIDER_SITE_OTHER): Payer: 59

## 2013-05-19 ENCOUNTER — Encounter (INDEPENDENT_AMBULATORY_CARE_PROVIDER_SITE_OTHER): Payer: Self-pay

## 2013-05-19 ENCOUNTER — Ambulatory Visit (INDEPENDENT_AMBULATORY_CARE_PROVIDER_SITE_OTHER): Payer: 59 | Admitting: Physician Assistant

## 2013-05-19 VITALS — BP 110/70 | HR 76 | Resp 14 | Ht 66.0 in | Wt 237.4 lb

## 2013-05-19 DIAGNOSIS — Z4651 Encounter for fitting and adjustment of gastric lap band: Secondary | ICD-10-CM

## 2013-05-19 NOTE — Patient Instructions (Signed)

## 2013-05-19 NOTE — Progress Notes (Signed)
  HISTORY: Ariel Stewart is a 47 y.o.female who received an AP-Standard lap-band in April 2009 by Dr. Daphine Deutscher. She comes in with 2 lbs weight gain since her last visit. She is getting serial fills for a previous lap band holiday where Dr. Daphine Deutscher removed 5 mL fluid. She now has 3.5 mL back in. She denies regurgitation or reflux but her hunger is significant.  VITAL SIGNS: Filed Vitals:   05/19/13 1108  BP: 110/70  Pulse: 76  Resp: 14    PHYSICAL EXAM: Physical exam reveals a very well-appearing 47 y.o.female in no apparent distress Neurologic: Awake, alert, oriented Psych: Bright affect, conversant Respiratory: Breathing even and unlabored. No stridor or wheezing Abdomen: Soft, nontender, nondistended to palpation. Incisions well-healed. No incisional hernias. Port easily palpated. Extremities: Atraumatic, good range of motion.  ASSESMENT: 47 y.o.  female  s/p AP-Standard lap-band.   PLAN: The patient's port was accessed with a 20G Huber needle without difficulty. Clear fluid was aspirated and 1 mL saline was added to the port. The patient was able to swallow water without difficulty following the procedure and was instructed to take clear liquids for the next 24-48 hours and advance slowly as tolerated.

## 2013-08-18 ENCOUNTER — Encounter (INDEPENDENT_AMBULATORY_CARE_PROVIDER_SITE_OTHER): Payer: 59

## 2013-09-08 ENCOUNTER — Ambulatory Visit: Payer: 59 | Admitting: Gynecologic Oncology

## 2013-10-06 ENCOUNTER — Ambulatory Visit: Payer: 59 | Admitting: Gynecologic Oncology

## 2013-11-16 ENCOUNTER — Telehealth: Payer: Self-pay | Admitting: *Deleted

## 2013-11-16 NOTE — Telephone Encounter (Signed)
Pt called to change appt time on 1/21. Moved pt appt from 0845 to 2:45pm on 11/23/13. Reminded pt to arrive 40mins early for check in. Pt confirmed new appt time. No other concerns.

## 2013-11-23 ENCOUNTER — Ambulatory Visit: Payer: 59 | Admitting: Gynecologic Oncology

## 2013-11-23 ENCOUNTER — Encounter: Payer: Self-pay | Admitting: Gynecologic Oncology

## 2013-11-23 ENCOUNTER — Ambulatory Visit: Payer: 59 | Attending: Gynecologic Oncology | Admitting: Gynecologic Oncology

## 2013-11-23 VITALS — BP 142/92 | HR 76 | Temp 98.5°F | Resp 16 | Ht 65.0 in | Wt 235.1 lb

## 2013-11-23 DIAGNOSIS — J45909 Unspecified asthma, uncomplicated: Secondary | ICD-10-CM | POA: Insufficient documentation

## 2013-11-23 DIAGNOSIS — Z8544 Personal history of malignant neoplasm of other female genital organs: Secondary | ICD-10-CM | POA: Insufficient documentation

## 2013-11-23 DIAGNOSIS — E785 Hyperlipidemia, unspecified: Secondary | ICD-10-CM | POA: Insufficient documentation

## 2013-11-23 DIAGNOSIS — F17200 Nicotine dependence, unspecified, uncomplicated: Secondary | ICD-10-CM | POA: Insufficient documentation

## 2013-11-23 DIAGNOSIS — I10 Essential (primary) hypertension: Secondary | ICD-10-CM | POA: Insufficient documentation

## 2013-11-23 DIAGNOSIS — K644 Residual hemorrhoidal skin tags: Secondary | ICD-10-CM | POA: Insufficient documentation

## 2013-11-23 DIAGNOSIS — Z9884 Bariatric surgery status: Secondary | ICD-10-CM | POA: Insufficient documentation

## 2013-11-23 DIAGNOSIS — E119 Type 2 diabetes mellitus without complications: Secondary | ICD-10-CM | POA: Insufficient documentation

## 2013-11-23 DIAGNOSIS — C519 Malignant neoplasm of vulva, unspecified: Secondary | ICD-10-CM

## 2013-11-23 DIAGNOSIS — Z79899 Other long term (current) drug therapy: Secondary | ICD-10-CM | POA: Insufficient documentation

## 2013-11-23 DIAGNOSIS — K219 Gastro-esophageal reflux disease without esophagitis: Secondary | ICD-10-CM | POA: Insufficient documentation

## 2013-11-23 DIAGNOSIS — Z09 Encounter for follow-up examination after completed treatment for conditions other than malignant neoplasm: Secondary | ICD-10-CM | POA: Insufficient documentation

## 2013-11-23 NOTE — Progress Notes (Signed)
Consult Note: Gyn-Onc  Ariel Stewart 48 y.o. female  CC:  Chief Complaint  Patient presents with  . Vulvar Cancer    follow up    HPI: Ariel Stewart is a very pleasant 48 year old with a stage I-B vulvar carcinoma. She saw Dr. Ouida Stewart initially in May 2011. 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 PET CT revealed no abnormal intake in the vulva. However, there was one external iliac node measuring 1.2 cm that did not have any uptake. She was referred to Ariel Stewart and subsequently August 2011 she underwent repeat wide local excision and bilateral groin node dissection. There was a 2 cm left groin node that was slightly enlarged but soft and pathology was negative on the right of 0/5 lymph nodes and 0/2 on the left. The vulva revealed focal high-grade dysplasia extending to the margins but there was no residual invasive carcinoma.   I last saw her in 4/14 at which time her exam was Negative. Her Pap smear was negative in 2/14 with Dr. Ouida Stewart. She had an IUD placed in 2/14 to decrease her bleeding and it has decreased the amount of her menstrual flow. This was in an effort to help with her iron deficiency anemia. She had both upper and lower endoscopy. On endoscopy found a benign polyp that was removed. Temporally related to her upper endoscopy there was an obstruction of her lap band and it had to be deflated. Dr. Hassell Stewart attempted to reinflated and she began having symptoms of nausea and vomiting again. She sees him next week for reconsideration of inflating her lap band again.  She has gained about 12 pounds since I last saw her last April.  She comes in today for followup.  She herself is enjoying her job very much. Her husband is working as a Administrator and has gone 5 days of the week.  She denies any complaints, any  change in bowel or bladder habits, any abdominal or pelvic pain, nausea, vomiting, fevers or chills.   REVIEW OF SYSTEMS: Ten-point review of systems is negative. She denies any abnormal vulvar bleeding she has not had any more boils on her vulva. She's otherwise doing very well. MMG Stewart 2/14. Pap 2/14 was negative. Her weight is down from 246 pounds to 235. Her goal weight is 199. She's had the stress recently as her husband aunt had a stroke and has now moved in with them and has been very difficult. Her cycles are light in and erratic. She states about every third cycle is a "normal" cycle. She is frustrated again with her weight. She has followup with her primary physician in February to get a thyroid panel checked. She sees Dr. Ouida Stewart in February of this year for renal examination, Pap smear, mammogram. There are no new medical problems in the family.   Current Meds:  Outpatient Encounter Prescriptions as of 11/23/2013  Medication Sig  . ALPRAZolam (XANAX) 1 MG tablet   . citalopram (CELEXA) 40 MG tablet Take 40 mg by mouth daily.  Marland Kitchen omeprazole (PRILOSEC) 20 MG capsule   . RELPAX 40 MG tablet One tablet by mouth as needed for migraine headache.  If the headache improves and then returns, dose may be repeated after 2 hours have elapsed since first dose (do not exceed 80 mg per day).  . rosuvastatin (  CRESTOR) 20 MG tablet Take 20 mg by mouth daily.    . traMADol-acetaminophen (ULTRACET) 37.5-325 MG per tablet Take 1 tablet by mouth every 6 (six) hours as needed for pain.  . VENTOLIN HFA 108 (90 BASE) MCG/ACT inhaler as needed.     Allergy:  Allergies  Allergen Reactions  . Penicillin G     Rash    . Sulfa Antibiotics     Genital swelling   . Wellbutrin [Bupropion] Other (See Comments)     agaition    Social Hx:   History   Social History  . Marital Status: Married    Spouse Name: N/A    Number of Children: N/A  . Years of Education: N/A   Occupational History  . Not on  file.   Social History Main Topics  . Smoking status: Current Every Day Smoker -- 0.50 packs/day  . Smokeless tobacco: Never Used     Comment: smoking information given  . Alcohol Use: Yes  . Drug Use: No  . Sexual Activity: Yes   Other Topics Concern  . Not on file   Social History Narrative  . No narrative on file    Past Surgical Hx:  Past Surgical History  Procedure Laterality Date  . Tonsillectomy  1985  . Knee surgery  [id number], 2010    scope   . Elbow surgery  2009    torn tendon   . Cesarean section  2001, 2002  . Laparoscopic gastric banding  April 2009   . Melanoma excision  February 2010   . Lithotripsy  2009-2010    x3   . Vulva surgery  may 2011, August 2011    cancer     Past Medical Hx:  Past Medical History  Diagnosis Date  . Asthma   . GERD (gastroesophageal reflux disease)   . Diabetes mellitus   . Hypertension   . Hyperlipidemia   . 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 No pertinent family history.  Vitals:  Blood pressure 142/92, pulse 76, temperature 98.5 F (36.9 C), resp. rate 16, height 5\' 5"  (1.651 m), weight 235 lb 1.6 oz (106.641 kg).  Physical Exam: Well-nourished well-developed female in no acute distress.   Neck: No lymphadenopathy no thyromegaly.   Lungs: Clear to auscultation bilaterally.   Cardiovascular: Regular rate and rhythm.   Abdomen: Soft nontender nondistended there are no palpable masses or hepatosplenomegaly. Exam is limited by habitus. There is a prominent pannus overhanging the mons.   Groins: No lymphadenopathy   Pelvic: External genitalia is notable for surgical excision. There is evidence of hidradenitis scarring. She has external hemorrhoids. There are no new vulvar lesions. There is no hyperkeratosis. No palpable vulvar lesions.    Assessment/Plan:  48 year old with history of stage IB vulvar carcinoma who clinically has no evidence of recurrent disease. She  will return to see Ariel Stewart in 6 months. She will followup with her other physicians as scheduled.  Ariel Stewart A., MD 11/23/2013, 3:04 PM

## 2013-11-23 NOTE — Patient Instructions (Signed)
Return to clinic in 6 months.

## 2013-12-19 ENCOUNTER — Other Ambulatory Visit: Payer: Self-pay | Admitting: Obstetrics and Gynecology

## 2014-05-04 ENCOUNTER — Ambulatory Visit: Payer: 59 | Attending: Gynecologic Oncology | Admitting: Gynecologic Oncology

## 2014-05-04 ENCOUNTER — Encounter: Payer: Self-pay | Admitting: Gynecologic Oncology

## 2014-05-04 VITALS — BP 130/85 | HR 72 | Temp 98.2°F | Resp 18 | Ht 65.0 in | Wt 235.1 lb

## 2014-05-04 DIAGNOSIS — C519 Malignant neoplasm of vulva, unspecified: Secondary | ICD-10-CM

## 2014-05-04 DIAGNOSIS — I10 Essential (primary) hypertension: Secondary | ICD-10-CM | POA: Insufficient documentation

## 2014-05-04 DIAGNOSIS — Z8544 Personal history of malignant neoplasm of other female genital organs: Secondary | ICD-10-CM

## 2014-05-04 DIAGNOSIS — E785 Hyperlipidemia, unspecified: Secondary | ICD-10-CM | POA: Insufficient documentation

## 2014-05-04 DIAGNOSIS — C4359 Malignant melanoma of other part of trunk: Secondary | ICD-10-CM | POA: Insufficient documentation

## 2014-05-04 DIAGNOSIS — Z79899 Other long term (current) drug therapy: Secondary | ICD-10-CM | POA: Insufficient documentation

## 2014-05-04 DIAGNOSIS — K219 Gastro-esophageal reflux disease without esophagitis: Secondary | ICD-10-CM | POA: Insufficient documentation

## 2014-05-04 DIAGNOSIS — E119 Type 2 diabetes mellitus without complications: Secondary | ICD-10-CM | POA: Insufficient documentation

## 2014-05-04 DIAGNOSIS — J45909 Unspecified asthma, uncomplicated: Secondary | ICD-10-CM | POA: Insufficient documentation

## 2014-05-04 DIAGNOSIS — D071 Carcinoma in situ of vulva: Secondary | ICD-10-CM | POA: Insufficient documentation

## 2014-05-04 DIAGNOSIS — F17200 Nicotine dependence, unspecified, uncomplicated: Secondary | ICD-10-CM | POA: Insufficient documentation

## 2014-05-04 NOTE — Progress Notes (Signed)
Consult Note: Gyn-Onc  Ariel Stewart 48 y.o. female  CC:  Chief Complaint  Patient presents with  . VIN    Follow up     HPI: Ariel Stewart is a very pleasant 48 year old with a stage I-B vulvar carcinoma. She saw Dr. Ouida Sills initially in May 2011. 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 PET CT revealed no abnormal intake in the vulva. However, there was one external iliac node measuring 1.2 cm that did not have any uptake. She was referred to [place] and subsequently August 2011 she underwent repeat wide local excision and bilateral groin node dissection. There was a 2 cm left groin node that was slightly enlarged but soft and pathology was negative on the right of 0/5 lymph nodes and 0/2 on the left. The vulva revealed focal high-grade dysplasia extending to the margins but there was no residual invasive carcinoma.   I last saw her in 1/15 at which time her exam was Negative. Her Pap smear was negative in 2/14 with Dr. Ouida Sills. She had an IUD placed in 2/14 to decrease her bleeding and it has decreased the amount of her menstrual flow. This was in an effort to help with her iron deficiency anemia. She had both upper and lower endoscopy. On endoscopy found a benign polyp that was removed. Temporally related to her upper endoscopy there was an obstruction of her lap band and it had to be deflated. Dr. Hassell Done attempted to reinflated and she began having symptoms of nausea and vomiting again.   She comes in today for followup.  She herself is enjoying her job very much. She denies any complaints, any change in bowel or bladder habits, any abdominal or pelvic pain, nausea, vomiting, fevers or chills.   REVIEW OF SYSTEMS: Ten-point review of systems is negative. She started exercising a few months ago. Concomitant with  that she began having some abdominal cramping paracolic was more on the abdominal wall musculature the bladder Dan internally. It'll have been 5-6 times per day and that happens about once a week. She's not been exercising is much lately as on June 6 she pulled a hamstring on her right leg. The discomfort that she has her abdomen does not wake her up at night. It is random throughout the day and does not appear to be associated with her diet more her activities. Since it started a few months ago it has not gotten progressively worse. She denies a change in her bowel or bladder habits. She denies any new vulvar irritation or soreness. She has no vulvar bleeding.   Current Meds:  Outpatient Encounter Prescriptions as of 05/04/2014  Medication Sig  . ALPRAZolam (XANAX) 1 MG tablet   . citalopram (CELEXA) 40 MG tablet Take 40 mg by mouth daily.  Marland Kitchen omeprazole (PRILOSEC) 20 MG capsule   . rosuvastatin (CRESTOR) 20 MG tablet Take 20 mg by mouth daily.    . SUMAtriptan (IMITREX) 25 MG tablet Take 25 mg by mouth every 2 (two) hours as needed for migraine or headache. May repeat in 2 hours if headache persists or recurs.  . VENTOLIN HFA 108 (90 BASE) MCG/ACT inhaler as needed.   . Vitamin D, Ergocalciferol, (DRISDOL) 50000 UNITS CAPS capsule Take 50,000 Units by mouth 2 (two) times a week.  . [DISCONTINUED] RELPAX 40 MG tablet One  tablet by mouth as needed for migraine headache.  If the headache improves and then returns, dose may be repeated after 2 hours have elapsed since first dose (do not exceed 80 mg per day).  . [DISCONTINUED] traMADol-acetaminophen (ULTRACET) 37.5-325 MG per tablet Take 1 tablet by mouth every 6 (six) hours as needed for pain.    Allergy:  Allergies  Allergen Reactions  . Penicillin G     Rash    . Sulfa Antibiotics     Genital swelling   . Wellbutrin [Bupropion] Other (See Comments)     agaition    Social Hx:   History   Social History  . Marital Status: Married     Spouse Name: N/A    Number of Children: N/A  . Years of Education: N/A   Occupational History  . Not on file.   Social History Main Topics  . Smoking status: Current Every Day Smoker -- 0.50 packs/day  . Smokeless tobacco: Never Used     Comment: smoking information given  . Alcohol Use: Yes  . Drug Use: No  . Sexual Activity: Yes   Other Topics Concern  . Not on file   Social History Narrative  . No narrative on file    Past Surgical Hx:  Past Surgical History  Procedure Laterality Date  . Tonsillectomy  1985  . Knee surgery  [id number], 2010    scope   . Elbow surgery  2009    torn tendon   . Cesarean section  2001, 2002  . Laparoscopic gastric banding  April 2009   . Melanoma excision  February 2010   . Lithotripsy  2009-2010    x3   . Vulva surgery  may 2011, August 2011    cancer     Past Medical Hx:  Past Medical History  Diagnosis Date  . Asthma   . GERD (gastroesophageal reflux disease)   . Diabetes mellitus   . Hypertension   . Hyperlipidemia   . 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 No pertinent family history.  Vitals:  Blood pressure 130/85, pulse 72, temperature 98.2 F (36.8 C), temperature source Oral, resp. rate 18, height 5\' 5"  (1.651 m), weight 235 lb 1.6 oz (106.641 kg).  Physical Exam: Well-nourished well-developed female in no acute distress.   Neck: No lymphadenopathy no thyromegaly.   Lungs: Clear to auscultation bilaterally.   Cardiovascular: Regular rate and rhythm.   Abdomen: Soft nontender nondistended there are no palpable masses or hepatosplenomegaly. Exam is limited by habitus. There is a prominent pannus overhanging the mons.   Groins: No lymphadenopathy   Pelvic: External genitalia is notable for surgical excision. There is evidence of hidradenitis scarring. She has external hemorrhoids. There are no new vulvar lesions. There is no hyperkeratosis. No palpable vulvar  lesions.    Assessment/Plan:  48 year old with history of stage IB vulvar carcinoma who clinically has no evidence of recurrent disease. She will return to see [place] in 6 months. She will followup with her other physicians as scheduled.  Merideth Bosque A., MD 05/04/2014, 10:03 AM

## 2014-05-04 NOTE — Patient Instructions (Addendum)
Return to clinic in 6 months.

## 2014-11-30 ENCOUNTER — Emergency Department (HOSPITAL_COMMUNITY)
Admission: EM | Admit: 2014-11-30 | Discharge: 2014-11-30 | Disposition: A | Discharge status: Home / Self Care | Payer: 59 | Attending: Emergency Medicine | Admitting: Emergency Medicine

## 2014-11-30 ENCOUNTER — Encounter (HOSPITAL_COMMUNITY): Payer: Self-pay

## 2014-11-30 ENCOUNTER — Emergency Department (HOSPITAL_COMMUNITY): Payer: 59

## 2014-11-30 DIAGNOSIS — Z79899 Other long term (current) drug therapy: Secondary | ICD-10-CM | POA: Diagnosis not present

## 2014-11-30 DIAGNOSIS — J45909 Unspecified asthma, uncomplicated: Secondary | ICD-10-CM | POA: Diagnosis not present

## 2014-11-30 DIAGNOSIS — Z720 Tobacco use: Secondary | ICD-10-CM | POA: Insufficient documentation

## 2014-11-30 DIAGNOSIS — E119 Type 2 diabetes mellitus without complications: Secondary | ICD-10-CM | POA: Diagnosis not present

## 2014-11-30 DIAGNOSIS — K219 Gastro-esophageal reflux disease without esophagitis: Secondary | ICD-10-CM | POA: Diagnosis not present

## 2014-11-30 DIAGNOSIS — I10 Essential (primary) hypertension: Secondary | ICD-10-CM | POA: Diagnosis not present

## 2014-11-30 DIAGNOSIS — Z85820 Personal history of malignant melanoma of skin: Secondary | ICD-10-CM | POA: Diagnosis not present

## 2014-11-30 DIAGNOSIS — R0789 Other chest pain: Secondary | ICD-10-CM | POA: Insufficient documentation

## 2014-11-30 DIAGNOSIS — Z880 Allergy status to penicillin: Secondary | ICD-10-CM | POA: Insufficient documentation

## 2014-11-30 DIAGNOSIS — R079 Chest pain, unspecified: Secondary | ICD-10-CM | POA: Diagnosis present

## 2014-11-30 DIAGNOSIS — E785 Hyperlipidemia, unspecified: Secondary | ICD-10-CM | POA: Insufficient documentation

## 2014-11-30 LAB — BASIC METABOLIC PANEL
Anion gap: 9 (ref 5–15)
BUN: 9 mg/dL (ref 6–23)
CHLORIDE: 102 mmol/L (ref 96–112)
CO2: 25 mmol/L (ref 19–32)
CREATININE: 0.75 mg/dL (ref 0.50–1.10)
Calcium: 9.1 mg/dL (ref 8.4–10.5)
GFR calc Af Amer: >90 mL/min (ref >90)
GFR calc non Af Amer: >90 mL/min (ref >90)
Glucose, Bld: 228 mg/dL — ABNORMAL HIGH (ref 70–99)
POTASSIUM: 3.7 mmol/L (ref 3.5–5.1)
Sodium: 136 mmol/L (ref 135–145)

## 2014-11-30 LAB — I-STAT TROPONIN, ED
TROPONIN I, POC: 0 ng/mL (ref 0.00–0.08)
TROPONIN I, POC: 0 ng/mL (ref 0.00–0.08)

## 2014-11-30 LAB — CBC
HCT: 41.5 % (ref 36.0–46.0)
Hemoglobin: 13.7 g/dL (ref 12.0–15.0)
MCH: 29 pg (ref 26.0–34.0)
MCHC: 33 g/dL (ref 30.0–36.0)
MCV: 87.7 fL (ref 78.0–100.0)
Platelets: 403 10*3/uL — ABNORMAL HIGH (ref 150–400)
RBC: 4.73 MIL/uL (ref 3.87–5.11)
RDW: 13.2 % (ref 11.5–15.5)
WBC: 9.8 10*3/uL (ref 4.0–10.5)

## 2014-11-30 MED ORDER — GI COCKTAIL ~~LOC~~
30.0000 mL | Freq: Once | ORAL | Status: AC
Start: 2014-11-30 — End: 2014-11-30
  Administered 2014-11-30: 30 mL via ORAL
  Filled 2014-11-30: qty 30

## 2014-11-30 NOTE — ED Notes (Signed)
Pt reports substernal chest pain that started at 1015 this morning.  Sts originally pain started in her jaw and then radiated to chest but currently is only reporting pain to chest. Pain 5/10.  Denies any other s/s with chest pain. No cardiac hx reported.

## 2014-11-30 NOTE — ED Provider Notes (Signed)
CSN: [id number]     Arrival date & time 11/30/14  1408 History   First MD Initiated Contact with Patient 11/30/14 1537     Chief Complaint  Patient presents with  . Chest Pain   Ariel Stewart is a 49 y.o. female smoker with a history of diabetes, hypertension, hyperlipidemia and GERD who presents to the emergency department complaining of substernal nonradiating chest pain which began around 10:15 this morning. Patient reports she was sitting at her desk when she started having sharp jaw pain which quickly moved to her chest. She reports she had the worst pain for about 3 minutes. Pain is in her substernal chest nonradiating. Patient reports her chest felt tight afterwards and this is gradually improved. Patient puts her current pain is 2 out of 10. Patient denies any palpitations or shortness of breath. She denies any personal history of CVD. Patient reports family history with a father with MI at age 85. Patient denies personal or family history of DVTs or PEs. Patient denies exogenous estrogen use. Patient denies personal or family history of blood clotting disorders such as factor V Leiden, protein C or S deficiency. Patient denies recent surgery or long-haul air travel. The patient denies fevers, chills, cough, leg swelling, wheezing, shortness of breath, palpitations, abdominal pain, nausea, vomiting, numbness or tingling.  (Consider location/radiation/quality/duration/timing/severity/associated sxs/prior Treatment) HPI  Past Medical History  Diagnosis Date  . Asthma   . GERD (gastroesophageal reflux disease)   . Diabetes mellitus   . Hypertension   . Hyperlipidemia   . 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 Tonsillectomy  1985  . Knee surgery  [id number], 2010    scope   . Elbow surgery  2009    torn tendon   . Cesarean section  2001, 2002  . Laparoscopic gastric banding  April 2009   . Melanoma excision  February  2010   . Lithotripsy  2009-2010    x3   . Vulva surgery  may 2011, August 2011    cancer    History reviewed. No pertinent family history. History  Substance Use Topics  . Smoking status: Current Every Day Smoker -- 0.50 packs/day  . Smokeless tobacco: Never Used     Comment: smoking information given  . Alcohol Use: Yes   OB History    No data available     Review of Systems  Constitutional: Negative for fever and chills.  HENT: Negative for congestion, ear pain and sore throat.   Eyes: Negative for pain and visual disturbance.  Respiratory: Positive for chest tightness. Negative for cough, shortness of breath and wheezing.   Cardiovascular: Positive for chest pain. Negative for palpitations and leg swelling.  Gastrointestinal: Negative for nausea, vomiting, abdominal pain and diarrhea.  Genitourinary: Negative for dysuria and hematuria.  Musculoskeletal: Negative for back pain and neck pain.  Skin: Negative for rash.  Neurological: Negative for dizziness, syncope, weakness, light-headedness, numbness and headaches.      Allergies  Penicillin g; Sulfa antibiotics; and Wellbutrin  Home Medications   Prior to Admission medications   Medication Sig Start Date End Date Taking? Authorizing Provider  ALPRAZolam Duanne Moron) 1 MG tablet  07/28/11   Historical Provider, MD  citalopram (CELEXA) 40 MG tablet Take 40 mg by mouth daily.    Historical Provider, MD  omeprazole (PRILOSEC) 20 MG capsule  07/24/11   Historical Provider, MD  rosuvastatin (CRESTOR) 20 MG  tablet Take 20 mg by mouth daily.      Historical Provider, MD  SUMAtriptan (IMITREX) 25 MG tablet Take 25 mg by mouth every 2 (two) hours as needed for migraine or headache. May repeat in 2 hours if headache persists or recurs.    Historical Provider, MD  VENTOLIN HFA 108 (90 BASE) MCG/ACT inhaler as needed.  07/14/11   Historical Provider, MD  Vitamin D, Ergocalciferol, (DRISDOL) 50000 UNITS CAPS capsule Take 50,000 Units by  mouth 2 (two) times a week.    Historical Provider, MD   BP 135/77 mmHg  Pulse 66  Temp(Src) 98.2 F (36.8 C) (Oral)  Resp 20  SpO2 93% Physical Exam  Constitutional: She is oriented to person, place, and time. She appears well-developed and well-nourished. No distress.  HENT:  Head: Normocephalic and atraumatic.  Mouth/Throat: Oropharynx is clear and moist.  Eyes: Conjunctivae are normal. Pupils are equal, round, and reactive to light. Right eye exhibits no discharge. Left eye exhibits no discharge.  Neck: Neck supple.  Cardiovascular: Normal rate, regular rhythm, normal heart sounds and intact distal pulses.  Exam reveals no gallop and no friction rub.   No murmur heard. Bilateral radial pulses are intact. Bilateral posterior tibialis and dorsalis pedis pulses are intact.  Pulmonary/Chest: Effort normal and breath sounds normal. No respiratory distress. She has no wheezes. She has no rales.  Abdominal: Soft. Bowel sounds are normal. She exhibits no distension. There is no tenderness.  Musculoskeletal: She exhibits no edema or tenderness.  No lower extremity edema noted. No calf tenderness noted.  Lymphadenopathy:    She has no cervical adenopathy.  Neurological: She is alert and oriented to person, place, and time. Coordination normal.  Skin: Skin is warm and dry. No rash noted. She is not diaphoretic. No erythema. No pallor.  Psychiatric: She has a normal mood and affect. Her behavior is normal.  Nursing note and vitals reviewed.   ED Course  Procedures (including critical care time) Labs Review Labs Reviewed  CBC - Abnormal; Notable for the following:    Platelets 403 (*)    All other components within normal limits  BASIC METABOLIC PANEL - Abnormal; Notable for the following:    Glucose, Bld 228 (*)    All other components within normal limits  I-STAT TROPOININ, ED  Randolm Idol, ED    Imaging Review Dg Chest 2 View  11/30/2014   CLINICAL DATA:  Central chest  heaviness and jaw pain today. Diabetes.  EXAM: CHEST  2 VIEW  COMPARISON:  10/21/2007  FINDINGS: Mild hyperinflation. Lower thoracic spondylosis. Surgical clips which project over the right axilla and either the lateral left breast or the left axilla. Midline trachea. Borderline cardiomegaly. Mediastinal contours otherwise within normal limits. No pleural effusion or pneumothorax. Clear lungs.  IMPRESSION: Borderline cardiomegaly, without acute disease.   Electronically Signed   By: Abigail Miyamoto M.D.   On: 11/30/2014 15:04     EKG Interpretation   Date/Time:  Thursday November 30 2014 14:19:22 EST Ventricular Rate:  81 PR Interval:  156 QRS Duration: 86 QT Interval:  380 QTC Calculation: 441 R Axis:   61 Text Interpretation:  Normal sinus rhythm Low voltage QRS Borderline ECG  No significant change since last tracing Confirmed by HARRISON  MD,  FORREST ([id number]) on 11/30/2014 5:41:09 PM      Filed Vitals:   11/30/14 1645 11/30/14 1715 11/30/14 1730 11/30/14 1745  BP: 117/49 133/101 119/67 135/77  Pulse: 70 73 65 66  Temp:      TempSrc:      Resp: 12 20 16 20   SpO2: 93% 91% 94% 93%     MDM   Meds given in ED:  Medications  gi cocktail (Maalox,Lidocaine,Donnatal) (30 mLs Oral Given 11/30/14 1646)    Discharge Medication List as of 11/30/2014  6:34 PM      Final diagnoses:  Chest pain, unspecified chest pain type   This is a 49 year old female with a history of diabetes, GERD, hypertension and hyperlipidemia who presented to the emergency department complaining of substernal chest pain starting around 10:15 this morning. Patient's pain started Brigitte Pulse then radiated to her chest. At the time I evaluation her pain was 2 out of 10 in her substernal chest. The patient is a smoker. The patient is afebrile and nontoxic-appearing. The patient's EKG indicates normal sinus rhythm with no significant change from last tracing. Patient is not tachypneic, tachycardic or hypoxic. CBC and BMP are  unremarkable. Patient's initial troponin is negative. Delta troponin is negative as well. Chest x-ray indicates borderline cardiomegaly without acute disease. At re-evaluation the patient reports her pain is 1 out of 10. Patient feels ready to be discharge. Strict return precautions were provided. I advised the patient to follow-up with their primary care provider this week. I advised the patient to return to the emergency department with new or worsening symptoms or new concerns. The patient verbalized understanding and agreement with plan.   This patient was discussed with and evaluated by Dr. Aline Brochure who agrees with assessment and plan.     Verda Cumins Larina Lieurance, PA-C 11/30/14 2159  Pamella Pert, MD 12/01/14 [phone]

## 2014-11-30 NOTE — Discharge Instructions (Signed)

## 2014-12-20 ENCOUNTER — Other Ambulatory Visit: Payer: Self-pay | Admitting: Obstetrics and Gynecology

## 2014-12-22 LAB — CYTOLOGY - PAP

## 2014-12-28 ENCOUNTER — Encounter: Payer: Self-pay | Admitting: Gynecologic Oncology

## 2014-12-28 ENCOUNTER — Ambulatory Visit: Payer: 59 | Attending: Gynecologic Oncology | Admitting: Gynecologic Oncology

## 2014-12-28 VITALS — BP 124/74 | HR 84 | Temp 98.3°F | Resp 18 | Ht 65.0 in | Wt 239.8 lb

## 2014-12-28 DIAGNOSIS — N939 Abnormal uterine and vaginal bleeding, unspecified: Secondary | ICD-10-CM | POA: Diagnosis not present

## 2014-12-28 DIAGNOSIS — C519 Malignant neoplasm of vulva, unspecified: Secondary | ICD-10-CM | POA: Diagnosis not present

## 2014-12-28 NOTE — Progress Notes (Signed)
Consult Note: Gyn-Onc  Ariel Stewart 49 y.o. female  CC:  Chief Complaint  Patient presents with  . Vulvar Cancer    HPI: [name] is a very pleasant 49 year old with a stage I-B vulvar carcinoma. She saw Dr. Ouida Sills initially in May 2011. 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 PET CT revealed no abnormal intake in the vulva. However, there was one external iliac node measuring 1.2 cm that did not have any uptake. She was referred to [place] and subsequently August 2011 she underwent repeat wide local excision and bilateral groin node dissection. There was a 2 cm left groin node that was slightly enlarged but soft and pathology was negative on the right of 0/5 lymph nodes and 0/2 on the left. The vulva revealed focal high-grade dysplasia extending to the margins but there was no residual invasive carcinoma.   I last saw her in 7/15 at which time her exam was Negative. Her Pap smear was negative in 2/16 with Dr. Ouida Sills. Her mammogram was also unremarkable. Unfortunately, when Dr. Ouida Sills saw her he noticed a lesion on the right posterior fourchette. This was biopsied and returned as VIN 3 with no evidence of invasive carcinoma identified.   She had an IUD placed in 2/14 to decrease her bleeding and it has decreased the amount of her menstrual flow. This was in an effort to help with her iron deficiency anemia. She had both upper and lower endoscopy. On endoscopy found a benign polyp that was removed. Temporally related to her upper endoscopy there was an obstruction of her lap band and it had to be deflated. Dr. Hassell Done attempted to reinflated and she began having symptoms of nausea and vomiting again.   She comes in today for followup.  She of course is very upset about the VIN-III biopsy. She also complains of  20 days of dark bleeding in light of persistent she usually will have a period every month and bleed for about 2-3 days. She recently went to months without bleeding and then essentially has been having the dark spotting/bleeding since February 3. She did have blood work recently her hemoglobin is about 12 her cholesterol was 198. She had an MRI in October that showed partial tears of her bilateral hamstrings and she's been undergoing physical therapy and injections. She otherwise has no complaints.   Current Meds:  Outpatient Encounter Prescriptions as of 12/28/2014  Medication Sig  . ALPRAZolam (XANAX) 1 MG tablet   . citalopram (CELEXA) 40 MG tablet Take 40 mg by mouth daily.  Marland Kitchen omeprazole (PRILOSEC) 20 MG capsule   . SUMAtriptan (IMITREX) 25 MG tablet Take 25 mg by mouth every 2 (two) hours as needed for migraine or headache. May repeat in 2 hours if headache persists or recurs.  . Vitamin D, Ergocalciferol, (DRISDOL) 50000 UNITS CAPS capsule Take 50,000 Units by mouth 2 (two) times a week.  . diclofenac (VOLTAREN) 75 MG EC tablet   . rosuvastatin (CRESTOR) 20 MG tablet Take 20 mg by mouth daily.    . VENTOLIN HFA 108 (90 BASE) MCG/ACT inhaler as needed.     Allergy:  Allergies  Allergen Reactions  . Penicillin G     Rash    . Sulfa Antibiotics     Genital swelling   . Wellbutrin [Bupropion] Other (See Comments)  agaition    Social Hx:   History   Social History  . Marital Status: Married    Spouse Name: N/A  . Number of Children: N/A  . Years of Education: N/A   Occupational History  . Not on file.   Social History Main Topics  . Smoking status: Current Every Day Smoker -- 0.50 packs/day  . Smokeless tobacco: Never Used     Comment: smoking information given  . Alcohol Use: Yes  . Drug Use: No  . Sexual Activity: Yes   Other Topics Concern  . Not on file   Social History Narrative    Past Surgical Hx:  Past Surgical History  Procedure Laterality Date  .  Tonsillectomy  1985  . Knee surgery  [id number], 2010    scope   . Elbow surgery  2009    torn tendon   . Cesarean section  2001, 2002  . Laparoscopic gastric banding  April 2009   . Melanoma excision  February 2010   . Lithotripsy  2009-2010    x3   . Vulva surgery  may 2011, August 2011    cancer     Past Medical Hx:  Past Medical History  Diagnosis Date  . Asthma   . GERD (gastroesophageal reflux disease)   . Diabetes mellitus   . Hypertension   . Hyperlipidemia   . 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 No pertinent family history.  Vitals:  Blood pressure 124/74, pulse 84, temperature 98.3 F (36.8 C), temperature source Oral, resp. rate 18, height 5\' 5"  (1.651 m), weight 239 lb 12.8 oz (108.773 kg).  Physical Exam: Well-nourished well-developed female in no acute distress.  Groins: No lymphadenopathy   Pelvic: External genitalia is notable for surgical excision. There is evidence of hidradenitis scarring. On the right vulva and near the posterior fourchette there is a new approximately 1 x 1.5 cm raised hyperkeratotic lesion with evidence of a biopsy site. There is no other visible lesions. She has a varicose vein on the left. She has external hemorrhoids.   Cervix is nulliparous. IUD string is seen. There are no visible lesions.   Endometrial biopsy: After obtaining her verbal consent the cervix was grasped with single-tooth tenaculum. Attempt was made to pass the endometrial biopsy [place] but there were cervical stenosis. Using the office finder/plastic dilator the internal os was cannulated. The endometrial biopsy was performed without difficulty. The uterus sounded to 12 cm. Moderate blood with tissue was obtained. The tenaculum was removed. There is no bleeding from this tenaculum sites. She tolerated the procedure well.  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 She's also having irregular bleeding. We will follow-up in results of her endometrial biopsy from today and I'll call her with results. We will schedule her for wide local excision of the vulva next Thursday (3/3) in the outpatient surgery Center. She understands if the endometrial biopsy shows a more significant lesion we may have to reschedule this for a day that there is more operative time depending on what the results show.  She is well aware of the risks of surgery including bleeding, infection, wound separation and of course positive margins and need for reexcision. Her questions as well as those of her husband were elicited in answer to their satisfaction.  Saralyn Willison A., MD 12/28/2014, 9:51 AM

## 2014-12-28 NOTE — Patient Instructions (Addendum)
Preparing for your Surgery  Plan for surgery on January 04, 2015  with Dr. Alycia Rossetti  Pre-operative Testing -You will receive a phone call from presurgical testing at [facility] to discuss pre-operative testing  before your surgery.        -You should not be taking blood thinners or aspirin at least ten days prior to surgery unless instructed by your surgeon.  Day Before Surgery at [place] will be asked to take in only clear liquids the day before surgery.  Examples of clear liquids include broths, jello, and clear juices.  You may also be advised to perform a Miralax bowel prep or fleets enema the night before your surgery based off of your provider's recommendations.  You will be advised to have nothing to eat or drink after midnight the evening before.    Your role in recovery Your role is to become active as soon as directed by your doctor, while still giving yourself time to heal.  Rest when you feel tired. You will be asked to do the following in order to speed your recovery:  - Cough and breathe deeply. This helps toclear and expand your lungs and can prevent pneumonia. You may be given a spirometer to practice deep breathing. A staff member will show you how to use the spirometer. - Do mild physical activity. Walking or moving your legs help your circulation and body functions return to normal. A staff member will help you when you try to walk and will provide you with simple exercises. Do not try to get up or walk alone the first time. - Actively manage your pain. Managing your pain lets you move in comfort. We will ask you to rate your pain on a scale of zero to 10. It is your responsibility to tell your doctor or nurse where and how much you hurt so your pain can be treated.  Special Considerations -If you are diabetic, you may be placed on insulin after surgery to have closer control over your blood sugars to promote healing and recovery.  This does not mean that  you will be discharged on insulin.  If applicable, your oral antidiabetics will be resumed when you are tolerating a solid diet.  -Your final pathology results from surgery should be available by the Friday after surgery and the results will be relayed to you when available.  Excision of Skin Lesions Excision of a skin lesion refers to the removal of a section of skin by making small cuts (incisions) in the skin. This is typically done to remove a cancerous growth (basal cell carcinoma, squamous cell carcinoma, or melanoma) or a noncancerous growth (cyst). It may be done to treat or prevent cancer or infection. It may also be done to improve cosmetic appearance (removal of mole, skin tag). LET YOUR CAREGIVER KNOW ABOUT:   Allergies to food or medicine.  Medicines taken, including vitamins, herbs, eyedrops, over-the-counter medicines, and creams.  Use of steroids (by mouth or creams).  Previous problems with anesthetics or numbing medicines.  History of bleeding problems or blood clots.  History of any prostheses.  Previous surgery.  Other health problems, including diabetes and kidney problems.  Possibility of pregnancy, if this applies. RISKS AND COMPLICATIONS  Many complications can be managed. With appropriate treatment and rehabilitation, the following complications are very uncommon:  Bleeding.  Infection.  Scarring.  Recurrence of cyst or cancer.  Changes in skin sensation or appearance (discoloration, swelling).  Reaction to anesthesia.  Allergic  reaction to surgical materials or ointments.  Damage to nerves, blood vessels, muscles, or other structures.  Continued pain. BEFORE THE PROCEDURE  It is important to follow your caregiver's instructions prior to your procedure to avoid complications. Steps before your procedure may include:  Physical exam, blood tests, other procedures, such as removing a small sample for examination under a microscope  (biopsy).  Your caregiver may review the procedure, the anesthesia being used, and what to expect after the procedure with you. You may be asked to:  Stop taking certain medicines, such as blood thinners (including aspirin, clopidogrel, ibuprofen), for several days prior to your procedure.  Take certain medicines.  Stop smoking. It is a good idea to arrange for a ride home after surgery and to have someone to help you with activities during recovery. PROCEDURE  There are several excision techniques. The type of excision or surgical technique used will depend on your condition, the location of the lesion, and your overall health. After the lesion is sterilized and a local anesthetic is applied, the following may be performed: Complete surgical excision The area to be removed is marked with a pen. Using a small scalpel and scissors, the surgeon gently cuts around and under the lesion until it is completely removed. The lesion is placed in a special fluid and sent to the lab for examination. If necessary, bleeding will be controlled with a device that delivers heat. The edges of the wound are stitched together and a dressing is applied. This procedure may be performed to treat a cancerous growth or noncancerous cyst or lesion. Surgeons commonly perform an elliptical excision, to minimize scarring. Excision of a cyst The surgeon makes an incision on the cyst. The entire cyst is removed through the incision. The wound may be closed with a suture (stitch). Shave excision During shave excision, the surgeon uses a small blade or loop instrument to shave off the lesion. This may be done to remove a mole or skin tag. The wound is usually left to heal on its own without stitches. Punch excision During punch excision, the surgeon uses a small, round tool (like a cookie cutter) to cut a circle shape out of the skin. The outer edges of the skin are stitched together. This may be done to remove a mole or scar  or to perform a biopsy of the lesion. Mohs micrographic surgery During Mohs micrographic surgery, layers of the lesion are removed with a scalpel or loop instrument and immediately examined under a microscope until all of the abnormal or cancerous tissue is removed. This procedure is minimally invasive and ensures the best cosmetic outcome, with removal of as little normal tissue as possible. Mohs is usually done to treat skin cancer, such as basal cell carcinoma or squamous cell carcinoma, particularly on the face and ears. Antibiotic ointment is applied to the surgical area after each of the procedures listed above, as necessary. AFTER THE PROCEDURE  How well you heal depends on many factors. Most patients heal quite well with proper techniques and self-care. Scarring will lessen over time. HOME CARE INSTRUCTIONS   Take medicines for pain as directed.  Keep the incision area clean, dry, and protected for at least 48 hours. Change dressings as directed.  For bleeding, apply gentle but firm pressure to the wound using a folded towel for 20 minutes. Call your caregiver if bleeding does not stop.  Avoid high-impact exercise and activities until the stitches are removed or the area heals.  Follow  your caregiver's instructions to minimize scarring. Avoid sun exposure until the area has healed. Scarring should lessen over time.  Follow up with your caregiver as directed. Removal of stitches within 4 to 14 days may be necessary. Finding out the results of your test Not all test results are available during your visit. If your test results are not back during the visit, make an appointment with your caregiver to find out the results. Do not assume everything is normal if you have not heard from your caregiver or the medical facility. It is important for you to follow up on all of your test results. SEEK MEDICAL CARE IF:   You or your child has an oral temperature above 102 F (38.9 C).  You  develop signs of infection (chills, feeling unwell).  You notice bleeding, pain, discharge, redness, or swelling at the incision site.  You notice skin irregularities or changes in sensation. MAKE SURE YOU:   Understand these instructions.  Will watch your condition.  Will get help right away if you are not doing well or get worse. FOR MORE INFORMATION  American Academy of Family Physicians: www.AromatherapyParty.no American Academy of Dermatology: http://jones-macias.info/ Document Released: 01/14/2010 Document Revised: 01/12/2012 Document Reviewed: 01/14/2010 [facility] Patient Information 2015 [place], [place]. This information is not intended to replace advice given to you by your health care provider. Make sure you discuss any questions you have with your health care provider.   Endometrial Biopsy, Care After Refer to this sheet in the next few weeks. These instructions provide you with information on caring for yourself after your procedure. Your health care provider may also give you more specific instructions. Your treatment has been planned according to current medical practices, but problems sometimes occur. Call your health care provider if you have any problems or questions after your procedure. WHAT TO EXPECT AFTER THE PROCEDURE After your procedure, it is typical to have the following:  You may have mild cramping and a small amount of vaginal bleeding for a few days after the procedure. This is normal. HOME CARE INSTRUCTIONS  Only take over-the-counter or prescription medicine as directed by your health care provider.  Do not douche, use tampons, or have sexual intercourse until your health care provider approves.  Follow your health care provider's instructions regarding any activity restrictions, such as strenuous exercise or heavy lifting. SEEK MEDICAL CARE IF:  You have heavy bleeding or bleeding longer than 2 days after the procedure.  You have bad smelling drainage from your  vagina.  You have a fever and chills.  Youhave severe lower stomach (abdominal) pain. SEEK IMMEDIATE MEDICAL CARE IF:  You have severe cramps in your stomach or back.  You pass large blood clots.  Your bleeding increases.  You become weak or lightheaded, or you pass out. Document Released: 08/10/2013 Document Reviewed: 08/10/2013 [facility] Patient Information 2015 [place], [place]. This information is not intended to replace advice given to you by your health care provider. Make sure you discuss any questions you have with your health care provider.

## 2015-01-01 ENCOUNTER — Telehealth: Payer: Self-pay | Admitting: Gynecologic Oncology

## 2015-01-01 ENCOUNTER — Encounter (HOSPITAL_BASED_OUTPATIENT_CLINIC_OR_DEPARTMENT_OTHER): Payer: Self-pay | Admitting: *Deleted

## 2015-01-01 NOTE — Telephone Encounter (Signed)
Patient informed of biopsy results.  Reporting a mild cold with no fever or chills.  Advised to call the office with an update in one to two days prior to her procedure on Thurs.  Advised to call for any questions or concerns.

## 2015-01-02 ENCOUNTER — Encounter (HOSPITAL_BASED_OUTPATIENT_CLINIC_OR_DEPARTMENT_OTHER): Payer: Self-pay | Admitting: *Deleted

## 2015-01-02 NOTE — Progress Notes (Addendum)
NPO AFTER MN. ARRIVE AT 0700. NEEDS ISTAT AND URINE PREG.  CURRENT EKG IN CHART AND EPIC.

## 2015-01-03 ENCOUNTER — Encounter (HOSPITAL_BASED_OUTPATIENT_CLINIC_OR_DEPARTMENT_OTHER): Payer: Self-pay | Admitting: Anesthesiology

## 2015-01-03 NOTE — Anesthesia Preprocedure Evaluation (Addendum)
Anesthesia Evaluation  Patient identified by MRN, date of birth, ID band Patient awake    Reviewed: Allergy & Precautions, NPO status , Patient's Chart, lab work & pertinent test results  History of Anesthesia Complications (+) history of anesthetic complications  Airway Mallampati: II  TM Distance: >3 FB Neck ROM: Full    Dental no notable dental hx.    Pulmonary asthma , sleep apnea , Current Smoker,  Mild OSA  CXR: borderline cardiomegaly breath sounds clear to auscultation  Pulmonary exam normal       Cardiovascular Exercise Tolerance: Good negative cardio ROS  Rhythm:Regular Rate:Normal  ECG: NSR, low voltage.   Neuro/Psych negative neurological ROS  negative psych ROS   GI/Hepatic Neg liver ROS, hiatal hernia, GERD-  Medicated,  Endo/Other  diabetes, Well ControlledMorbid obesityDiet controlled diabetes.  Renal/GU negative Renal ROS  negative genitourinary   Musculoskeletal  (+) Arthritis -,   Abdominal (+) + obese,   Peds negative pediatric ROS (+)  Hematology negative hematology ROS (+)   Anesthesia Other Findings   Reproductive/Obstetrics negative OB ROS                           Anesthesia Physical Anesthesia Plan  ASA: III  Anesthesia Plan: General   Post-op Pain Management:    Induction: Intravenous  Airway Management Planned: LMA  Additional Equipment:   Intra-op Plan:   Post-operative Plan: Extubation in OR  Informed Consent: I have reviewed the patients History and Physical, chart, labs and discussed the procedure including the risks, benefits and alternatives for the proposed anesthesia with the patient or authorized representative who has indicated his/her understanding and acceptance.   Dental advisory given  Plan Discussed with: CRNA  Anesthesia Plan Comments: (Discussed MAC and general. Prefers general.)       Anesthesia Quick Evaluation

## 2015-01-04 ENCOUNTER — Ambulatory Visit (HOSPITAL_BASED_OUTPATIENT_CLINIC_OR_DEPARTMENT_OTHER)
Admission: RE | Admit: 2015-01-04 | Discharge: 2015-01-04 | Disposition: A | Discharge status: Home / Self Care | Payer: 59 | Source: Ambulatory Visit | Attending: Gynecologic Oncology | Admitting: Gynecologic Oncology

## 2015-01-04 ENCOUNTER — Ambulatory Visit (HOSPITAL_BASED_OUTPATIENT_CLINIC_OR_DEPARTMENT_OTHER): Payer: 59 | Admitting: Anesthesiology

## 2015-01-04 ENCOUNTER — Encounter (HOSPITAL_BASED_OUTPATIENT_CLINIC_OR_DEPARTMENT_OTHER)
Admission: RE | Disposition: A | Discharge status: Home / Self Care | Payer: Self-pay | Source: Ambulatory Visit | Attending: Gynecologic Oncology

## 2015-01-04 ENCOUNTER — Encounter (HOSPITAL_BASED_OUTPATIENT_CLINIC_OR_DEPARTMENT_OTHER): Payer: Self-pay | Admitting: *Deleted

## 2015-01-04 ENCOUNTER — Ambulatory Visit: Payer: 59 | Admitting: Gynecologic Oncology

## 2015-01-04 DIAGNOSIS — E785 Hyperlipidemia, unspecified: Secondary | ICD-10-CM | POA: Diagnosis not present

## 2015-01-04 DIAGNOSIS — I10 Essential (primary) hypertension: Secondary | ICD-10-CM | POA: Diagnosis not present

## 2015-01-04 DIAGNOSIS — D071 Carcinoma in situ of vulva: Secondary | ICD-10-CM | POA: Diagnosis not present

## 2015-01-04 DIAGNOSIS — K219 Gastro-esophageal reflux disease without esophagitis: Secondary | ICD-10-CM | POA: Insufficient documentation

## 2015-01-04 DIAGNOSIS — J45909 Unspecified asthma, uncomplicated: Secondary | ICD-10-CM | POA: Insufficient documentation

## 2015-01-04 DIAGNOSIS — M1990 Unspecified osteoarthritis, unspecified site: Secondary | ICD-10-CM | POA: Insufficient documentation

## 2015-01-04 DIAGNOSIS — Z791 Long term (current) use of non-steroidal anti-inflammatories (NSAID): Secondary | ICD-10-CM | POA: Insufficient documentation

## 2015-01-04 DIAGNOSIS — F17200 Nicotine dependence, unspecified, uncomplicated: Secondary | ICD-10-CM | POA: Diagnosis not present

## 2015-01-04 DIAGNOSIS — G4730 Sleep apnea, unspecified: Secondary | ICD-10-CM | POA: Insufficient documentation

## 2015-01-04 DIAGNOSIS — Z85820 Personal history of malignant melanoma of skin: Secondary | ICD-10-CM | POA: Insufficient documentation

## 2015-01-04 DIAGNOSIS — Z6841 Body Mass Index (BMI) 40.0 and over, adult: Secondary | ICD-10-CM | POA: Diagnosis not present

## 2015-01-04 DIAGNOSIS — E119 Type 2 diabetes mellitus without complications: Secondary | ICD-10-CM | POA: Insufficient documentation

## 2015-01-04 DIAGNOSIS — E669 Obesity, unspecified: Secondary | ICD-10-CM | POA: Insufficient documentation

## 2015-01-04 HISTORY — DX: Type 2 diabetes mellitus without complications: E11.9

## 2015-01-04 HISTORY — DX: Carcinoma in situ of vulva: D07.1

## 2015-01-04 HISTORY — DX: Personal history of other diseases of the circulatory system: Z86.79

## 2015-01-04 HISTORY — DX: Personal history of malignant neoplasm of other female genital organs: Z85.44

## 2015-01-04 HISTORY — DX: Adverse effect of unspecified anesthetic, initial encounter: T41.45XA

## 2015-01-04 HISTORY — DX: Personal history of malignant melanoma of skin: Z85.820

## 2015-01-04 HISTORY — DX: Obstructive sleep apnea (adult) (pediatric): G47.33

## 2015-01-04 HISTORY — DX: Unspecified osteoarthritis, unspecified site: M19.90

## 2015-01-04 HISTORY — DX: Other complications of anesthesia, initial encounter: T88.59XA

## 2015-01-04 HISTORY — DX: Personal history of other diseases of the digestive system: Z87.19

## 2015-01-04 HISTORY — DX: Personal history of urinary calculi: Z87.442

## 2015-01-04 HISTORY — PX: VULVAR LESION REMOVAL: SHX5391

## 2015-01-04 HISTORY — DX: Bariatric surgery status: Z98.84

## 2015-01-04 HISTORY — DX: Other specified postprocedural states: Z98.890

## 2015-01-04 LAB — POCT PREGNANCY, URINE: Preg Test, Ur: NEGATIVE

## 2015-01-04 LAB — POCT I-STAT 4, (NA,K, GLUC, HGB,HCT)
GLUCOSE: 143 mg/dL — AB (ref 70–99)
HCT: 41 % (ref 36.0–46.0)
Hemoglobin: 13.9 g/dL (ref 12.0–15.0)
Potassium: 4.1 mmol/L (ref 3.5–5.1)
Sodium: 137 mmol/L (ref 135–145)

## 2015-01-04 LAB — GLUCOSE, CAPILLARY: Glucose-Capillary: 123 mg/dL — ABNORMAL HIGH (ref 70–99)

## 2015-01-04 SURGERY — VULVAR LESION
Anesthesia: General | Site: Vulva

## 2015-01-04 MED ORDER — BUPIVACAINE HCL 0.5 % IJ SOLN
INTRAMUSCULAR | Status: DC | PRN
  Administered 2015-01-04: 8 mL

## 2015-01-04 MED ORDER — MIDAZOLAM HCL 5 MG/5ML IJ SOLN
INTRAMUSCULAR | Status: DC | PRN
  Administered 2015-01-04: 2 mg via INTRAVENOUS

## 2015-01-04 MED ORDER — ACETIC ACID 5 % SOLN
Status: DC | PRN
  Administered 2015-01-04: 1 via TOPICAL

## 2015-01-04 MED ORDER — DEXAMETHASONE SODIUM PHOSPHATE 4 MG/ML IJ SOLN
INTRAMUSCULAR | Status: DC | PRN
  Administered 2015-01-04: 5 mg via INTRAVENOUS

## 2015-01-04 MED ORDER — ACETAMINOPHEN 10 MG/ML IV SOLN
INTRAVENOUS | Status: DC | PRN
  Administered 2015-01-04: 1000 mg via INTRAVENOUS

## 2015-01-04 MED ORDER — OXYCODONE-ACETAMINOPHEN 5-325 MG PO TABS: 2.0000 | ORAL_TABLET | Freq: Four times a day (QID) | ORAL | Status: DC | PRN

## 2015-01-04 MED ORDER — ONDANSETRON HCL 4 MG/2ML IJ SOLN
INTRAMUSCULAR | Status: DC | PRN
  Administered 2015-01-04: 4 mg via INTRAVENOUS

## 2015-01-04 MED ORDER — FENTANYL CITRATE 0.05 MG/ML IJ SOLN
25.0000 ug | INTRAMUSCULAR | Status: DC | PRN
  Filled 2015-01-04: qty 1

## 2015-01-04 MED ORDER — SODIUM CHLORIDE 0.9 % IR SOLN
Status: DC | PRN
  Administered 2015-01-04: 500 mL

## 2015-01-04 MED ORDER — MIDAZOLAM HCL 2 MG/2ML IJ SOLN
INTRAMUSCULAR | Status: AC
  Filled 2015-01-04: qty 2

## 2015-01-04 MED ORDER — PROPOFOL 10 MG/ML IV BOLUS
INTRAVENOUS | Status: DC | PRN
Start: 2015-01-04 — End: 2015-01-04
  Administered 2015-01-04: 200 mg via INTRAVENOUS

## 2015-01-04 MED ORDER — KETOROLAC TROMETHAMINE 30 MG/ML IJ SOLN
INTRAMUSCULAR | Status: DC | PRN
  Administered 2015-01-04: 30 mg via INTRAVENOUS

## 2015-01-04 MED ORDER — LACTATED RINGERS IV SOLN
INTRAVENOUS | Status: DC
  Administered 2015-01-04: 08:00:00 via INTRAVENOUS
  Filled 2015-01-04: qty 1000

## 2015-01-04 MED ORDER — PROMETHAZINE HCL 25 MG/ML IJ SOLN
6.2500 mg | INTRAMUSCULAR | Status: DC | PRN
Start: 2015-01-04 — End: 2015-01-04
  Filled 2015-01-04: qty 1

## 2015-01-04 MED ORDER — LIDOCAINE HCL (CARDIAC) 20 MG/ML IV SOLN
INTRAVENOUS | Status: DC | PRN
  Administered 2015-01-04: 100 mg via INTRAVENOUS

## 2015-01-04 MED ORDER — FENTANYL CITRATE 0.05 MG/ML IJ SOLN
INTRAMUSCULAR | Status: AC
  Filled 2015-01-04: qty 2

## 2015-01-04 SURGICAL SUPPLY — 36 items
APPLICATOR COTTON TIP 6IN STRL (MISCELLANEOUS) IMPLANT
BLADE SURG 15 STRL LF DISP TIS (BLADE) ×1 IMPLANT
BLADE SURG 15 STRL SS (BLADE) ×2
CANISTER SUCTION 1200CC (MISCELLANEOUS) IMPLANT
CANISTER SUCTION 2500CC (MISCELLANEOUS) ×1 IMPLANT
CATH ROBINSON RED A/P 16FR (CATHETERS) ×2 IMPLANT
COVER TABLE BACK 60X90 (DRAPES) ×2 IMPLANT
DRAPE LG THREE QUARTER DISP (DRAPES) ×2 IMPLANT
DRAPE UNDERBUTTOCKS STRL (DRAPE) ×2 IMPLANT
DRSG TELFA 3X8 NADH (GAUZE/BANDAGES/DRESSINGS) IMPLANT
ELECT REM PT RETURN 9FT ADLT (ELECTROSURGICAL) ×2
ELECTRODE REM PT RTRN 9FT ADLT (ELECTROSURGICAL) ×1 IMPLANT
GLOVE BIO SURGEON STRL SZ 6.5 (GLOVE) ×4 IMPLANT
GLOVE INDICATOR 7.0 STRL GRN (GLOVE) ×2 IMPLANT
GOWN PREVENTION PLUS LG XLONG (DISPOSABLE) ×1 IMPLANT
GOWN STRL REUS W/TWL LRG LVL3 (GOWN DISPOSABLE) ×4 IMPLANT
LEGGING LITHOTOMY PAIR STRL (DRAPES) ×2 IMPLANT
NDL HYPO 25X1 1.5 SAFETY (NEEDLE) IMPLANT
NEEDLE HYPO 25X1 1.5 SAFETY (NEEDLE) IMPLANT
NS IRRIG 500ML POUR BTL (IV SOLUTION) ×1 IMPLANT
PACK BASIN DAY SURGERY FS (CUSTOM PROCEDURE TRAY) ×2 IMPLANT
PAD DRESSING TELFA 3X8 NADH (GAUZE/BANDAGES/DRESSINGS) IMPLANT
PAD OB MATERNITY 4.3X12.25 (PERSONAL CARE ITEMS) ×2 IMPLANT
PAD PREP 24X48 CUFFED NSTRL (MISCELLANEOUS) ×2 IMPLANT
PENCIL BUTTON HOLSTER BLD 10FT (ELECTRODE) ×2 IMPLANT
SCOPETTES 8  STERILE (MISCELLANEOUS)
SCOPETTES 8 STERILE (MISCELLANEOUS) IMPLANT
SUT VIC AB 2-0 SH 27 (SUTURE) ×2
SUT VIC AB 2-0 SH 27XBRD (SUTURE) ×1 IMPLANT
SUT VIC AB 3-0 SH 27 (SUTURE) ×2
SUT VIC AB 3-0 SH 27X BRD (SUTURE) ×1 IMPLANT
TOWEL OR 17X24 6PK STRL BLUE (TOWEL DISPOSABLE) ×4 IMPLANT
TRAY DSU PREP LF (CUSTOM PROCEDURE TRAY) ×2 IMPLANT
TUBE CONNECTING 12X1/4 (SUCTIONS) ×2 IMPLANT
WATER STERILE IRR 500ML POUR (IV SOLUTION) ×2 IMPLANT
YANKAUER SUCT BULB TIP NO VENT (SUCTIONS) ×2 IMPLANT

## 2015-01-04 NOTE — H&P (Signed)
CC:  Chief Complaint  Patient presents with  . Vulvar Cancer    HPI: [name] is a very pleasant 49 year old with a stage I-B vulvar carcinoma. She saw Dr. Ouida Sills initially in May 2011. 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 PET CT revealed no abnormal intake in the vulva. However, there was one external iliac node measuring 1.2 cm that did not have any uptake. She was referred to [place] and subsequently August 2011 she underwent repeat wide local excision and bilateral groin node dissection. There was a 2 cm left groin node that was slightly enlarged but soft and pathology was negative on the right of 0/5 lymph nodes and 0/2 on the left. The vulva revealed focal high-grade dysplasia extending to the margins but there was no residual invasive carcinoma.   I last saw her in 7/15 at which time her exam was Negative. Her Pap smear was negative in 2/16 with Dr. Ouida Sills. Her mammogram was also unremarkable. Unfortunately, when Dr. Ouida Sills saw her he noticed a lesion on the right posterior fourchette. This was biopsied and returned as VIN 3 with no evidence of invasive carcinoma identified.   She had an IUD placed in 2/14 to decrease her bleeding and it has decreased the amount of her menstrual flow. This was in an effort to help with her iron deficiency anemia. She had both upper and lower endoscopy. On endoscopy found a benign polyp that was removed. Temporally related to her upper endoscopy there was an obstruction of her lap band and it had to be deflated. Dr. Hassell Done attempted to reinflated and she began having symptoms of nausea and vomiting again.   She comes in today for followup. She of course is very upset about the VIN-III biopsy. She also complains of 20 days of dark bleeding in light of persistent she  usually will have a period every month and bleed for about 2-3 days. She recently went to months without bleeding and then essentially has been having the dark spotting/bleeding since February 3. She did have blood work recently her hemoglobin is about 12 her cholesterol was 198. She had an MRI in October that showed partial tears of her bilateral hamstrings and she's been undergoing physical therapy and injections. She otherwise has no complaints.   Current Meds:  Outpatient Encounter Prescriptions as of 12/28/2014  Medication Sig  . ALPRAZolam (XANAX) 1 MG tablet   . citalopram (CELEXA) 40 MG tablet Take 40 mg by mouth daily.  Marland Kitchen omeprazole (PRILOSEC) 20 MG capsule   . SUMAtriptan (IMITREX) 25 MG tablet Take 25 mg by mouth every 2 (two) hours as needed for migraine or headache. May repeat in 2 hours if headache persists or recurs.  . Vitamin D, Ergocalciferol, (DRISDOL) 50000 UNITS CAPS capsule Take 50,000 Units by mouth 2 (two) times a week.  . diclofenac (VOLTAREN) 75 MG EC tablet   . rosuvastatin (CRESTOR) 20 MG tablet Take 20 mg by mouth daily.   . VENTOLIN HFA 108 (90 BASE) MCG/ACT inhaler as needed.     Allergy:  Allergies  Allergen Reactions  . Penicillin G     Rash    . Sulfa Antibiotics     Genital swelling   . Wellbutrin [Bupropion] Other (See Comments)    agaition    Social Hx:  History   Social  History  . Marital Status: Married    Spouse Name: N/A  . Number of Children: N/A  . Years of Education: N/A   Occupational History  . Not on file.   Social History Main Topics  . Smoking status: Current Every Day Smoker -- 0.50 packs/day  . Smokeless tobacco: Never Used     Comment: smoking information given  . Alcohol Use: Yes  . Drug Use: No  . Sexual Activity: Yes   Other Topics Concern  . Not on file   Social History Narrative    Past Surgical Hx:   Past Surgical History  Procedure Laterality Date  . Tonsillectomy  1985  . Knee surgery  [id number], 2010    scope   . Elbow surgery  2009    torn tendon   . Cesarean section  2001, 2002  . Laparoscopic gastric banding  April 2009   . Melanoma excision  February 2010   . Lithotripsy  2009-2010    x3   . Vulva surgery  may 2011, August 2011    cancer     Past Medical Hx:  Past Medical History  Diagnosis Date  . Asthma   . GERD (gastroesophageal reflux disease)   . Diabetes mellitus   . Hypertension   . Hyperlipidemia   . 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 No pertinent family history.  Vitals: Blood pressure 124/74, pulse 84, temperature 98.3 F (36.8 C), temperature source Oral, resp. rate 18, height 5\' 5"  (1.651 m), weight 239 lb 12.8 oz (108.773 kg).  Physical Exam: Well-nourished well-developed female in no acute distress.  Groins: No lymphadenopathy   Pelvic: External genitalia is notable for surgical excision. There is evidence of hidradenitis scarring. On the right vulva and near the posterior fourchette there is a new approximately 1 x 1.5 cm raised hyperkeratotic lesion with evidence of a biopsy site. There is no other visible lesions. She has a varicose vein on the left. She has external hemorrhoids.   Cervix is nulliparous. IUD string is seen. There are no visible lesions.   Endometrial biopsy: After obtaining her verbal consent the cervix was grasped with single-tooth tenaculum. Attempt was made to pass the endometrial biopsy [place] but there were cervical stenosis. Using the office finder/plastic dilator the internal os was cannulated. The endometrial biopsy was performed without difficulty. The uterus sounded to 12 cm. Moderate blood with tissue was obtained. The tenaculum was removed. There is no bleeding from this tenaculum sites. She  tolerated the procedure well.  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 She's also having irregular bleeding. We will follow-up in results of her endometrial biopsy from today and I'll call her with results. We will schedule her for wide local excision of the vulva next Thursday (3/3) in the outpatient surgery Center. She understands if the endometrial biopsy shows a more significant lesion we may have to reschedule this for a day that there is more operative time depending on what the results show.  She is well aware of the risks of surgery including bleeding, infection, wound separation and of course positive margins and need for reexcision. Her questions as well as those of her husband were elicited in answer to their satisfaction.

## 2015-01-04 NOTE — Op Note (Signed)
PATIENT: Ariel Stewart DATE OF BIRTH: 07/22/1966 ENCOUNTER DATE: 01/04/15   Preop Diagnosis: VIN3  Postoperative Diagnosis: Same  Surgery: Wide local excision of the vulva  Surgeons:  Tariq Pernell A. Alycia Rossetti, MD  Anesthesia: General with LMA and 8 mL 1/2% marcaine  Anesthesiologist: Denneny  Estimated blood loss: 10 mL  IVF: 250 mL  Urine output: 50 mL   Complications: None   Pathology: Vulvar excision marked at 12:00  Operative findings: AWE change on posterior right vulva near the perineum with evidence of prior biopsy site. No other visual lesions.  Procedure: The patient was identified in the preoperative holding area. Informed consent was signed on the chart. Patient was seen history was reviewed and exam was performed.   The patient was then taken to the operating room and placed in the supine position with SCD hose on. General anesthesia was then induced without difficulty. She was then placed in the dorsolithotomy position. The vulva was prepped with Betadine and I&0 catheter was inserted into the bladder under sterile conditions.  The patient was then draped after the prep was dried. Timeout was performed the patient, procedure, antibiotic, allergy, and length of procedure. Acetic acid was placed on the vulva and the lesion measured approximately 2 cm x 1.5 cm with biopsy site. The area was marked with a marking pen. 8 mL of 1/2% marcaine was injected. An elliptical incision was made around the AWE change with at least a 0.5 cm margin. The skin was excised to the appropriate depth. Hemostasis was obtained with pinpoint cautery. The skin was re-approximated with 3.0 vicryl interrupted sutures. The specimen was marked at 12:00 and sent to pathology.  All instrument, suture, laparotomy, Ray-Tec, and needle counts were correct x2. The patient tolerated the procedure well and was taken recovery room in stable condition. This is Ariel Stewart dictating an operative note on Ariel Stewart.

## 2015-01-04 NOTE — Interval H&P Note (Signed)
History and Physical Interval Note:  01/04/2015 8:31 AM  Ariel Stewart  has presented today for surgery, with the diagnosis of VOLVAR DYSPLASIA  The various methods of treatment have been discussed with the patient and family. After consideration of risks, benefits and other options for treatment, the patient has consented to  Procedure(s): WIDE LOCAL EXCISION OF RIGHT VULVAR  (N/A) as a surgical intervention .  The patient's history has been reviewed, patient examined, no change in status, stable for surgery.  I have reviewed the patient's chart and labs.  Questions were answered to the patient's satisfaction.     [place] A.

## 2015-01-04 NOTE — Discharge Instructions (Signed)

## 2015-01-04 NOTE — Transfer of Care (Signed)
Immediate Anesthesia Transfer of Care Note  Patient: Ariel Stewart  Procedure(s) Performed: Procedure(s): WIDE LOCAL EXCISION OF RIGHT VULVA (N/A)  Patient Location: PACU  Anesthesia Type:General  Level of Consciousness: awake, alert  and oriented  Airway & Oxygen Therapy: Patient Spontanous Breathing and Patient connected to nasal cannula oxygen  Post-op Assessment: Report given to RN  Post vital signs: Reviewed and stable  Last Vitals:  Filed Vitals:   01/04/15 0705  BP: 138/81  Pulse: 80  Temp: 36.8 C  Resp: 16    Complications: No apparent anesthesia complications

## 2015-01-04 NOTE — Anesthesia Postprocedure Evaluation (Signed)
  Anesthesia Post-op Note  Patient: Ariel Stewart  Procedure(s) Performed: Procedure(s) (LRB): WIDE LOCAL EXCISION OF RIGHT VULVA (N/A)  Patient Location: PACU  Anesthesia Type: General  Level of Consciousness: awake and alert   Airway and Oxygen Therapy: Patient Spontanous Breathing  Post-op Pain: mild  Post-op Assessment: Post-op Vital signs reviewed, Patient's Cardiovascular Status Stable, Respiratory Function Stable, Patent Airway and No signs of Nausea or vomiting  Last Vitals:  Filed Vitals:   01/04/15 0945  BP: 111/68  Pulse: 70  Temp:   Resp: 18    Post-op Vital Signs: stable   Complications: No apparent anesthesia complications

## 2015-01-04 NOTE — Anesthesia Procedure Notes (Signed)
Procedure Name: LMA Insertion Date/Time: 01/04/2015 8:45 AM Performed by: Bethena Roys T Pre-anesthesia Checklist: Patient identified, Emergency Drugs available, Suction available and Patient being monitored Patient Re-evaluated:Patient Re-evaluated prior to inductionOxygen Delivery Method: Circle System Utilized Preoxygenation: Pre-oxygenation with 100% oxygen Intubation Type: IV induction Ventilation: Mask ventilation without difficulty LMA: LMA with gastric port inserted LMA Size: 5.0 Number of attempts: 1 Placement Confirmation: positive ETCO2 Tube secured with: Tape Dental Injury: Teeth and Oropharynx as per pre-operative assessment

## 2015-01-05 ENCOUNTER — Encounter: Payer: Self-pay | Admitting: Gynecologic Oncology

## 2015-01-05 ENCOUNTER — Encounter (HOSPITAL_BASED_OUTPATIENT_CLINIC_OR_DEPARTMENT_OTHER): Payer: Self-pay | Admitting: Gynecologic Oncology

## 2015-01-08 ENCOUNTER — Telehealth: Payer: Self-pay | Admitting: Gynecologic Oncology

## 2015-01-08 NOTE — Telephone Encounter (Signed)
Patient informed of pathology results.  No concerns voiced.  "Little sore" but doing well since surgery.  Advised to call for any questions or concerns.  Follow up appt arranged for April 7.

## 2015-01-30 HISTORY — PX: CARDIAC CATHETERIZATION: SHX172

## 2015-02-08 ENCOUNTER — Ambulatory Visit: Payer: 59 | Attending: Gynecologic Oncology | Admitting: Gynecologic Oncology

## 2015-02-08 ENCOUNTER — Encounter: Payer: Self-pay | Admitting: Gynecologic Oncology

## 2015-02-08 VITALS — BP 120/82 | HR 86 | Temp 98.3°F | Resp 18 | Ht 65.0 in | Wt 238.8 lb

## 2015-02-08 DIAGNOSIS — G4733 Obstructive sleep apnea (adult) (pediatric): Secondary | ICD-10-CM | POA: Diagnosis not present

## 2015-02-08 DIAGNOSIS — J45909 Unspecified asthma, uncomplicated: Secondary | ICD-10-CM | POA: Insufficient documentation

## 2015-02-08 DIAGNOSIS — Z9884 Bariatric surgery status: Secondary | ICD-10-CM | POA: Diagnosis not present

## 2015-02-08 DIAGNOSIS — K449 Diaphragmatic hernia without obstruction or gangrene: Secondary | ICD-10-CM | POA: Diagnosis not present

## 2015-02-08 DIAGNOSIS — F17210 Nicotine dependence, cigarettes, uncomplicated: Secondary | ICD-10-CM | POA: Insufficient documentation

## 2015-02-08 DIAGNOSIS — Z8544 Personal history of malignant neoplasm of other female genital organs: Secondary | ICD-10-CM

## 2015-02-08 DIAGNOSIS — Z79899 Other long term (current) drug therapy: Secondary | ICD-10-CM | POA: Diagnosis not present

## 2015-02-08 DIAGNOSIS — E119 Type 2 diabetes mellitus without complications: Secondary | ICD-10-CM | POA: Diagnosis not present

## 2015-02-08 DIAGNOSIS — M1990 Unspecified osteoarthritis, unspecified site: Secondary | ICD-10-CM | POA: Insufficient documentation

## 2015-02-08 DIAGNOSIS — C519 Malignant neoplasm of vulva, unspecified: Secondary | ICD-10-CM | POA: Insufficient documentation

## 2015-02-08 DIAGNOSIS — K219 Gastro-esophageal reflux disease without esophagitis: Secondary | ICD-10-CM | POA: Diagnosis not present

## 2015-02-08 NOTE — Patient Instructions (Signed)
Return to clinic in 3-4 months. 

## 2015-02-08 NOTE — Progress Notes (Signed)
Consult Note: Gyn-Onc  Ariel Stewart 49 y.o. female  CC:  Chief Complaint  Patient presents with  . Vulvar Cancer    HPI: [name] is a very pleasant 49 year old with a stage I-B vulvar carcinoma. She saw Dr. Ouida Sills initially in May 2011. 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 PET CT revealed no abnormal intake in the vulva. However, there was one external iliac node measuring 1.2 cm that did not have any uptake. She was referred to [place] and subsequently August 2011 she underwent repeat wide local excision and bilateral groin node dissection. There was a 2 cm left groin node that was slightly enlarged but soft and pathology was negative on the right of 0/5 lymph nodes and 0/2 on the left. The vulva revealed focal high-grade dysplasia extending to the margins but there was no residual invasive carcinoma.   Her Pap smear was negative in 2/16 with Dr. Ouida Sills. Her mammogram was also unremarkable. Unfortunately, when Dr. Ouida Sills saw her recently he noticed a lesion on the right posterior fourchette. This was biopsied and returned as VIN 3 with no evidence of invasive carcinoma identified. I saw her in February and performed an endometrial biopsy secondary to bleeding. Her endometrial biopsy revealed benign and inactive endometrium.  She underwent wide local excision in the operating room on March 3. Pathology revealed high-grade dysplasia VIN 3 with negative margins. There is no invasive carcinoma. She comes in today for her postoperative check. She's overall doing quite well and really denies any significant complaints. She has had a cough and felt some discomfort with her cough and the perineal region. She had a cardiac catheterization on March 29 that revealed no evidence of any coronary disease. Her  cardiologist is Dr. Valetta Fuller. She has given been given a prescription for Chantix and is hoping to quit smoking. She states that she feels it would be "an insult to God" if she did not start to take better care of herself and quit smoking as she had a negative evaluation on cardiac catheterization she is very grateful for that.    Current Meds:  Outpatient Encounter Prescriptions as of 02/08/2015  Medication Sig  . ALPRAZolam (XANAX) 1 MG tablet Take 1 mg by mouth 2 (two) times daily as needed.   . citalopram (CELEXA) 40 MG tablet Take 40 mg by mouth every evening.   Marland Kitchen omeprazole (PRILOSEC) 20 MG capsule Take 20 mg by mouth every evening.   Marland Kitchen oxyCODONE-acetaminophen (PERCOCET/ROXICET) 5-325 MG per tablet Take 2 tablets by mouth every 6 (six) hours as needed for severe pain.  . SUMAtriptan (IMITREX) 25 MG tablet Take 25 mg by mouth every 2 (two) hours as needed for migraine or headache. May repeat in 2 hours if headache persists or recurs.  . varenicline (CHANTIX) 1 MG tablet Take 1/2 tablet daily PO for 3 days, then 1/2 tablet bid for 3 days, then 1 mg po bid  . VENTOLIN HFA 108 (90 BASE) MCG/ACT inhaler Inhale 1 puff into the lungs as needed.   . Vitamin D, Ergocalciferol, (DRISDOL) 50000 UNITS CAPS capsule Take 50,000 Units by mouth 2 (two) times a week.  . [DISCONTINUED] azithromycin (ZITHROMAX) 250 MG tablet     Allergy:  Allergies  Allergen Reactions  . Sulfa Antibiotics Swelling    Genital swelling   .  Wellbutrin [Bupropion] Other (See Comments)     agaition  . Penicillins Rash    Social Hx:   History   Social History  . Marital Status: Married    Spouse Name: N/A  . Number of Children: N/A  . Years of Education: N/A   Occupational History  . Not on file.   Social History Main Topics  . Smoking status: Current Every Day Smoker -- 0.50 packs/day for 30 years    Types: Cigarettes  . Smokeless tobacco: Never Used  . Alcohol Use: Yes     Comment: rare  . Drug Use: No   . Sexual Activity: Not on file   Other Topics Concern  . Not on file   Social History Narrative    Past Surgical Hx:  Past Surgical History  Procedure Laterality Date  . Tonsillectomy  1985  . Cesarean section  may 2001 &  sept 2002    BILATERAL TUBAL LIGATION WITH LAST C/S  . Laparoscopic gastric banding  02-14-2008    AND  CLOSURE HIATAL HERNIA  . Extracorporeal shock wave lithotripsy  x2 in 2009 // x1 2010  . Knee arthroscopy Bilateral left 12-07-2008 & 1988/  right 1989  . 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 Elbow debridement Left 07-27-2008  . Bilateral superficial inguinal lymph node dissection and wide local excision vulva  06-11-2010  . Wide local excision vulva  03-19-2010  . Cysto/  retograde pyelogram/  left ureteral stone extraction  07-19-2003  . Vulvar lesion removal N/A 01/04/2015    Procedure: WIDE LOCAL EXCISION OF RIGHT VULVA;  Surgeon: Imagene Gurney A. Alycia Rossetti, MD;  Location: [facility];  Service: Gynecology;  Laterality: N/A;  . Cardiac catheterization  01/30/2015    due to cardiac damage seen on stress test    Past Medical Hx:  Past Medical History  Diagnosis Date  . Asthma   . GERD (gastroesophageal reflux disease)   . History of hiatal hernia   . History of adjustable gastric banding   . History of kidney stones   . Arthritis   . History of melanoma excision     BACK --  WIDE EXCISION REMOVAL IN 2010  . History of cancer of vulva     STAGE IB-- SQUAMOUS CELL CARCINOMA S/P  WLE 2011  . VIN III (vulvar intraepithelial neoplasia III)   . History of hypertension     no issue or meds since gastric banding with wt loss  . Type 2 diabetes, diet controlled   . Complication of anesthesia     PT STATES SEVERE ITCHING POST-OP-- REQUEST ANTIHISTAMINE   . Mild obstructive sleep apnea     PER STUDY 10-16-2007-- NO CPAP RECOMMENDED    Family Hx: History reviewed.  No pertinent family history.  Vitals:  Blood pressure 120/82, pulse 86, temperature 98.3 F (36.8 C), temperature source Oral, resp. rate 18, height 5\' 5"  (1.651 m), weight 238 lb 12.8 oz (108.319 kg).  Physical Exam: Well-nourished well-developed female in no acute distress.  Pelvic: External genitalia is notable for surgical excision. There is evidence of hidradenitis scarring. There are no lesions, the right vulva is well healed.  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 Her recent wide local excision revealed VIN-III with negative margins. Additionally, she was having irregular bleeding. An endometrial biopsy revealed  benign inactive endometrium. She return to see me in 3-4 months. She was congratulated and encouraged with her smoking cessation. She contact me if she has any issues.  Ahyan Kreeger A., MD 02/08/2015, 10:21 AM

## 2015-03-31 ENCOUNTER — Encounter: Payer: Self-pay | Admitting: Emergency Medicine

## 2015-03-31 ENCOUNTER — Emergency Department
Admission: EM | Admit: 2015-03-31 | Discharge: 2015-03-31 | Disposition: A | Discharge status: Home / Self Care | Payer: 59 | Source: Home / Self Care | Attending: Emergency Medicine | Admitting: Emergency Medicine

## 2015-03-31 ENCOUNTER — Emergency Department (INDEPENDENT_AMBULATORY_CARE_PROVIDER_SITE_OTHER): Payer: 59

## 2015-03-31 DIAGNOSIS — W1789XA Other fall from one level to another, initial encounter: Secondary | ICD-10-CM | POA: Diagnosis not present

## 2015-03-31 DIAGNOSIS — S52501A Unspecified fracture of the lower end of right radius, initial encounter for closed fracture: Secondary | ICD-10-CM | POA: Diagnosis not present

## 2015-03-31 DIAGNOSIS — S300XXA Contusion of lower back and pelvis, initial encounter: Secondary | ICD-10-CM

## 2015-03-31 DIAGNOSIS — S60221A Contusion of right hand, initial encounter: Secondary | ICD-10-CM

## 2015-03-31 DIAGNOSIS — S52591A Other fractures of lower end of right radius, initial encounter for closed fracture: Secondary | ICD-10-CM | POA: Diagnosis not present

## 2015-03-31 DIAGNOSIS — S5011XA Contusion of right forearm, initial encounter: Secondary | ICD-10-CM

## 2015-03-31 DIAGNOSIS — M25521 Pain in right elbow: Secondary | ICD-10-CM | POA: Diagnosis not present

## 2015-03-31 DIAGNOSIS — M533 Sacrococcygeal disorders, not elsewhere classified: Secondary | ICD-10-CM | POA: Diagnosis not present

## 2015-03-31 DIAGNOSIS — M79641 Pain in right hand: Secondary | ICD-10-CM

## 2015-03-31 MED ORDER — KETOROLAC TROMETHAMINE 60 MG/2ML IM SOLN
60.0000 mg | Freq: Once | INTRAMUSCULAR | Status: AC
  Administered 2015-03-31: 60 mg via INTRAMUSCULAR

## 2015-03-31 MED ORDER — HYDROCODONE-ACETAMINOPHEN 5-325 MG PO TABS: 1.0000 | ORAL_TABLET | ORAL | Status: DC | PRN

## 2015-03-31 NOTE — ED Provider Notes (Signed)
CSN: [id number]     Arrival date & time 03/31/15  1329 History   First MD Initiated Contact with Patient 03/31/15 1346     Chief Complaint  Patient presents with  . Wrist Injury  . Leg Injury   Husband brings her in for acute injuries of right upper extremity and "tailbone" HPI Today outside their home, she was helping to build [employer]. She accidentally fell, landing on right upper extremity and "tailbone. Immediate sharp nonradiating pain right upper extremity, specifically right elbow, forearm and especially right wrist as well as right hand. Also nonradiating moderate to severe sharp midline coccyx pain. Mild pain over bilateral buttocks but no lateral hip pain. She was able to ambulate without any lower extremity symptoms. Has not tried any treatment for the pain. Denies head or neck injury. No loss of consciousness. Denies focal neurologic symptoms. No focal numbness or weakness. No ENT symptoms. No blurry vision or change of vision. Past Medical History  Diagnosis Date  . Asthma   . GERD (gastroesophageal reflux disease)   . History of hiatal hernia   . History of adjustable gastric banding   . History of kidney stones   . Arthritis   . History of melanoma excision     BACK --  WIDE EXCISION REMOVAL IN 2010  . History of cancer of vulva     STAGE IB-- SQUAMOUS CELL CARCINOMA S/P  WLE 2011  . VIN III (vulvar intraepithelial neoplasia III)   . History of hypertension     no issue or meds since gastric banding with wt loss  . Type 2 diabetes, diet controlled   . Complication of anesthesia     PT STATES SEVERE ITCHING POST-OP-- REQUEST ANTIHISTAMINE   . Mild obstructive sleep apnea     PER STUDY 10-16-2007-- NO CPAP RECOMMENDED   Past Surgical History  Procedure Laterality Date  . Tonsillectomy  1985  . Cesarean section  may 2001 &  sept 2002    BILATERAL TUBAL LIGATION WITH LAST C/S  . Laparoscopic gastric banding  02-14-2008    AND  CLOSURE HIATAL HERNIA  .  Extracorporeal shock wave lithotripsy  x2 in 2009 // x1 2010  . Knee arthroscopy Bilateral left 12-07-2008 & 1988/  right 1989  . 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 Elbow debridement Left 07-27-2008  . Bilateral superficial inguinal lymph node dissection and wide local excision vulva  06-11-2010  . Wide local excision vulva  03-19-2010  . Cysto/  retograde pyelogram/  left ureteral stone extraction  07-19-2003  . Vulvar lesion removal N/A 01/04/2015    Procedure: WIDE LOCAL EXCISION OF RIGHT VULVA;  Surgeon: Imagene Gurney A. Alycia Rossetti, MD;  Location: [facility];  Service: Gynecology;  Laterality: N/A;  . Cardiac catheterization  01/30/2015    due to cardiac damage seen on stress test   Family History  Problem Relation Age of Onset  . Diabetes Mother   . Heart failure Mother   . Diabetes Father    History  Substance Use Topics  . Smoking status: Current Every Day Smoker -- 0.50 packs/day for 30 years    Types: Cigarettes  . Smokeless tobacco: Never Used  . Alcohol Use: Yes     Comment: rare   OB History    No data available     Review of Systems  All other systems reviewed and are negative.  Remainder of Review of  Systems negative for acute change except as noted in the HPI.  Allergies  Sulfa antibiotics; Wellbutrin; and Penicillins  Home Medications   Prior to Admission medications   Medication Sig Start Date End Date Taking? Authorizing Provider  ALPRAZolam Duanne Moron) 1 MG tablet Take 1 mg by mouth 2 (two) times daily as needed.  07/28/11   Historical Provider, MD  citalopram (CELEXA) 40 MG tablet Take 40 mg by mouth every evening.     Historical Provider, MD  HYDROcodone-acetaminophen (NORCO/VICODIN) 5-325 MG per tablet Take 1-2 tablets by mouth every 4 (four) hours as needed for severe pain. Take with food. 03/31/15   Jacqulyn Cane, MD  omeprazole (PRILOSEC) 20 MG capsule Take 20 mg by  mouth every evening.  07/24/11   Historical Provider, MD  SUMAtriptan (IMITREX) 25 MG tablet Take 25 mg by mouth every 2 (two) hours as needed for migraine or headache. May repeat in 2 hours if headache persists or recurs.    Historical Provider, MD  VENTOLIN HFA 108 (90 BASE) MCG/ACT inhaler Inhale 1 puff into the lungs as needed.  07/14/11   Historical Provider, MD  Vitamin D, Ergocalciferol, (DRISDOL) 50000 UNITS CAPS capsule Take 50,000 Units by mouth 2 (two) times a week.    Historical Provider, MD   BP 120/79 mmHg  Pulse 80  Temp(Src) 98.1 F (36.7 C) (Oral)  Wt 233 lb (105.688 kg)  SpO2 98% Physical Exam  Constitutional: She is oriented to person, place, and time. She appears well-developed and well-nourished. No distress.  Alert, cooperative. In severe pain from right upper extremity and coccyx pain. She avoids moving her right upper extremity. She can ambulate very slowly on her own.  HENT:  Head: Normocephalic and atraumatic.  Mouth/Throat: Oropharynx is clear and moist.  Eyes: Conjunctivae and EOM are normal. Pupils are equal, round, and reactive to light. No scleral icterus.  Neck: Normal range of motion. Neck supple. No JVD present.  Cardiovascular: Normal rate and normal heart sounds.   Pulmonary/Chest: Effort normal and breath sounds normal.  Abdominal: She exhibits no distension. There is no tenderness. There is no guarding.  Musculoskeletal:       Right shoulder: Normal. She exhibits normal range of motion and no bony tenderness.       Right elbow: She exhibits decreased range of motion. Tenderness found.       Right wrist: She exhibits decreased range of motion, tenderness, bony tenderness (Especially tender over distal radius) and swelling. She exhibits no deformity and no laceration.       Cervical back: Normal. She exhibits no tenderness.       Thoracic back: Normal. She exhibits no tenderness.       Lumbar back: She exhibits tenderness (Severe point tenderness  directly over coccyx area. No lumbosacral tenderness or deformity).       Right forearm: She exhibits tenderness and bony tenderness.       Right hand: She exhibits decreased range of motion, tenderness and bony tenderness. She exhibits normal capillary refill and no laceration. Normal sensation noted. Normal strength noted.  Neurological: She is alert and oriented to person, place, and time. She has normal strength and normal reflexes. No cranial nerve deficit or sensory deficit.  Strength is normal, but she avoids using right upper extremity because of pain.  Skin: Skin is warm.  Psychiatric: She has a normal mood and affect.  Nursing note and vitals reviewed.   ED Course  Procedures (including critical care time) Labs Review  Labs Reviewed - No data to display  Imaging Review Dg Sacrum/coccyx  03/31/2015   CLINICAL DATA:  Patient fell earlier today, pain over sacrum and coccyx  EXAM: SACRUM AND COCCYX - 2+ VIEW  COMPARISON:  04/11/2010 PET scan  FINDINGS: Intrauterine device noted. 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 This is seen on prior imaging from 04/11/2010. No evidence of fracture involving the sacrum or coccyx.  IMPRESSION: No acute findings   Electronically Signed   By: Skipper Cliche M.D.   On: 03/31/2015 16:26   Dg Elbow Complete Right  03/31/2015   CLINICAL DATA:  Patient fell 4 hours ago and injured right elbow  EXAM: RIGHT ELBOW - COMPLETE 3+ VIEW  COMPARISON:  None.  FINDINGS: Medial compartment osteophyte present. No fracture or dislocation. No evidence of joint effusion.  IMPRESSION: Degenerative changes with no acute findings   Electronically Signed   By: Skipper Cliche M.D.   On: 03/31/2015 16:23   Dg Wrist Complete Right  03/31/2015   CLINICAL DATA:  Initial encounter. Pt fell off a hammock about 4 hours ago today and injured Rt arm; also sts she landed on a steel bar with her tailbone. Pt sts most of the pain is at the  wrist, the whole area. Pain runs down to pinky when rotating or moving wrist/hand, also runs up to her Rt elbow. There may be some minor swelling at the wrist, was kind of hard to tell. Pt in considerable amount of pain and could not move into position perfectly for all images, was also really hard for her to lie flat for sacrum/coccyx x-rays. Best images obtainable. Denies prior hx of injury to areas, although she sts hx of compression fx at lower back.  EXAM: RIGHT WRIST - COMPLETE 3+ VIEW  COMPARISON:  None.  FINDINGS: Nondisplaced fracture of the distal radial metaphysis. This is transverse and non comminuted. There is no angulation or impaction.  No other fractures. Joints are normally spaced and aligned. There is mild surrounding soft tissue swelling.  IMPRESSION: Nondisplaced fracture of the distal right radial metaphysis.   Electronically Signed   By: Lajean Manes M.D.   On: 03/31/2015 16:21   Dg Hand Complete Right  03/31/2015   CLINICAL DATA:  Initial encounter. Pt fell off a hammock about 4 hours ago today and injured Rt arm; also sts she landed on a steel bar with her tailbone. Pt sts most of the pain is at the wrist, the whole area. Pain runs down to pinky when rotating or moving wrist/hand, also runs up to her Rt elbow. There may be some minor swelling at the wrist, was kind of hard to tell. Pt in considerable amount of pain and could not move into position perfectly for all images, was also really hard for her to lie flat for sacrum/coccyx x-rays. Best images obtainable. Denies prior hx of injury to areas, although she sts hx of compression fx at lower back.  EXAM: RIGHT HAND - COMPLETE 3+ VIEW  COMPARISON:  None.  FINDINGS: No fracture. Joints are normally spaced and aligned. Soft tissues are unremarkable.  IMPRESSION: No fracture or acute finding.   Electronically Signed   By: Lajean Manes M.D.   On: 03/31/2015 16:20    Toradol 60 mg IM stat.--After 20 minutes of administration, patient  noted moderate relief of the severe pain MDM   1. Fracture of right distal radius, closed, initial encounter   2.  Contusion of right elbow and forearm, initial encounter   3. Contusion of right hand, initial encounter   4. Contusion of coccyx, initial encounter    Carefully reviewed the x-rays and findings of all the above x-rays. All the above x-rays negative except for nondisplaced fracture right distal radius. Treatment options discussed, as well as risks, benefits, alternatives. Patient and husband voiced understanding and agreement with the following plans:  Discharge Medication List as of 03/31/2015  4:50 PM    START taking these medications   Details  HYDROcodone-acetaminophen (NORCO/VICODIN) 5-325 MG per tablet Take 1-2 tablets by mouth every 4 (four) hours as needed for severe pain. Take with food., Starting 03/31/2015, Until Discontinued, Print      Tylenol or ibuprofen for mild to moderate pain Encourage rest, ice, compression with ACE bandage, and elevation of injured body part. I applied Ace bandage to right hand , wrist, and forearm. Long spica wrist splint applied. Follow-up with your orthopedist in 3-4 days. Precautions discussed. Handouts and written and verbal information given. Red flags discussed.--Emergency room if any red flag Questions invited and answered. Patient and husband voiced understanding and agreement. Over 45 minutes spent, greater than 50% of the time spent for counseling and coordination of care.    Jacqulyn Cane, MD 03/31/15 [phone]

## 2015-03-31 NOTE — ED Notes (Signed)
To Xray via WC

## 2015-03-31 NOTE — ED Notes (Signed)
Pt states she fell out of her hammock about 1 hour ago and landed on her right wrist and leg.

## 2015-06-06 ENCOUNTER — Encounter: Payer: Self-pay | Admitting: Gynecologic Oncology

## 2015-06-06 ENCOUNTER — Ambulatory Visit: Payer: 59 | Attending: Gynecologic Oncology | Admitting: Gynecologic Oncology

## 2015-06-06 VITALS — BP 120/74 | HR 77 | Temp 97.6°F | Resp 18 | Ht 65.0 in | Wt 230.5 lb

## 2015-06-06 DIAGNOSIS — C519 Malignant neoplasm of vulva, unspecified: Secondary | ICD-10-CM | POA: Insufficient documentation

## 2015-06-06 NOTE — Patient Instructions (Signed)
Follow up in 12 months with Dr. Alycia Rossetti and Dr. Ouida Sills in 6 months.

## 2015-06-06 NOTE — Progress Notes (Signed)
Consult Note: Gyn-Onc  Ariel Stewart 49 y.o. female  CC:  Chief Complaint  Patient presents with  . Vulvar Cancer    follow-up    HPI: [name] is a very pleasant 49 year old with a stage I-B vulvar carcinoma. She saw Dr. Ouida Sills initially in May 2011. 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 PET CT revealed no abnormal intake in the vulva. However, there was one external iliac node measuring 1.2 cm that did not have any uptake. She was referred to [place] and subsequently August 2011 she underwent repeat wide local excision and bilateral groin node dissection. There was a 2 cm left groin node that was slightly enlarged but soft and pathology was negative on the right of 0/5 lymph nodes and 0/2 on the left. The vulva revealed focal high-grade dysplasia extending to the margins but there was no residual invasive carcinoma.   Her Pap smear was negative in 2/16 with Dr. Ouida Sills. Her mammogram was also unremarkable. Unfortunately, when Dr. Ouida Sills saw her recently he noticed a lesion on the right posterior fourchette. This was biopsied and returned as VIN 3 with no evidence of invasive carcinoma identified. I saw her in February and performed an endometrial biopsy secondary to bleeding. Her endometrial biopsy revealed benign and inactive endometrium.  She underwent wide local excision in the operating room on January 04, 2015. Pathology revealed high-grade dysplasia VIN 3 with negative margins. There is no invasive carcinoma. She comes in today for her postoperative check. She's overall doing quite well and really denies any significant complaints. She has had a cough and felt some discomfort with her cough and the perineal region. She had a cardiac catheterization on March 29 that revealed no evidence of any coronary  disease. Her cardiologist is Dr. Valetta Fuller.   I last saw her April 2016. That time she had no evidence recurrent disease and she was very motivated to quit smoking. However, she continues to smoke about half a pack per day most related to stress. She broke her wrist to [facility] Day weekend getting enough hemic. She did not require surgery. It's been really quite painful and she's can undergo physical therapy before potentially having an MRI to evaluate her right wrist. She continues to have several boils on her vulva. She has not had been in quite some time to potentially because of the heat of the summer of increased some of them have resolved others are persistent there are no new medical problems and her family. She is due for colonoscopy in 2017. She's up-to-date on her mammograms as well as her Pap smears that she is under the care of Dr. Ouida Sills for both of these issues.  10 point review of systems is negative.    Current Meds:  Outpatient Encounter Prescriptions as of 06/06/2015  Medication Sig  . ALPRAZolam (XANAX) 1 MG tablet Take 1 mg by mouth 2 (two) times daily as needed.   . citalopram (CELEXA) 40 MG tablet Take 40 mg by mouth every evening.   . mupirocin ointment (BACTROBAN) 2 % Apply topically.  Marland Kitchen omeprazole (PRILOSEC) 20 MG capsule Take 20 mg by mouth every evening.   . SUMAtriptan (IMITREX) 25 MG tablet Take 25 mg by mouth every 2 (two) hours as needed for migraine or headache. May repeat in 2 hours if headache persists  or recurs.  . VENTOLIN HFA 108 (90 BASE) MCG/ACT inhaler Inhale 1 puff into the lungs as needed.   . Vitamin D, Ergocalciferol, (DRISDOL) 50000 UNITS CAPS capsule Take 50,000 Units by mouth 2 (two) times a week.  . [DISCONTINUED] HYDROcodone-acetaminophen (NORCO/VICODIN) 5-325 MG per tablet Take 1-2 tablets by mouth every 4 (four) hours as needed for severe pain. Take with food.   No facility-administered encounter medications on file as of 06/06/2015.     Allergy:  Allergies  Allergen Reactions  . Sulfa Antibiotics Swelling    Genital swelling   . Wellbutrin [Bupropion] Other (See Comments)     agaition  . Penicillins Rash    Social Hx:   History   Social History  . Marital Status: Married    Spouse Name: N/A  . Number of Children: N/A  . Years of Education: N/A   Occupational History  . Not on file.   Social History Main Topics  . Smoking status: Current Every Day Smoker -- 0.50 packs/day for 30 years    Types: Cigarettes  . Smokeless tobacco: Never Used  . Alcohol Use: Yes     Comment: rare  . Drug Use: No  . Sexual Activity: Yes    Birth Control/ Protection: Surgical   Other Topics Concern  . Not on file   Social History Narrative    Past Surgical Hx:  Past Surgical History  Procedure Laterality Date  . Tonsillectomy  1985  . Cesarean section  may 2001 &  sept 2002    BILATERAL TUBAL LIGATION WITH LAST C/S  . Laparoscopic gastric banding  02-14-2008    AND  CLOSURE HIATAL HERNIA  . Extracorporeal shock wave lithotripsy  x2 in 2009 // x1 2010  . Knee arthroscopy Bilateral left 12-07-2008 & 1988/  right 1989  . 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 Elbow debridement Left 07-27-2008  . Bilateral superficial inguinal lymph node dissection and wide local excision vulva  06-11-2010  . Wide local excision vulva  03-19-2010  . Cysto/  retograde pyelogram/  left ureteral stone extraction  07-19-2003  . Vulvar lesion removal N/A 01/04/2015    Procedure: WIDE LOCAL EXCISION OF RIGHT VULVA;  Surgeon: Imagene Gurney A. Alycia Rossetti, MD;  Location: [facility];  Service: Gynecology;  Laterality: N/A;  . Cardiac catheterization  01/30/2015    due to cardiac damage seen on stress test    Past Medical Hx:  Past Medical History  Diagnosis Date  . Asthma   . GERD (gastroesophageal reflux disease)   . History of hiatal hernia   .  History of adjustable gastric banding   . History of kidney stones   . Arthritis   . History of melanoma excision     BACK --  WIDE EXCISION REMOVAL IN 2010  . History of cancer of vulva     STAGE IB-- SQUAMOUS CELL CARCINOMA S/P  WLE 2011  . VIN III (vulvar intraepithelial neoplasia III)   . History of hypertension     no issue or meds since gastric banding with wt loss  . Type 2 diabetes, diet controlled   . Complication of anesthesia     PT STATES SEVERE ITCHING POST-OP-- REQUEST ANTIHISTAMINE   . Mild obstructive sleep apnea     PER STUDY 10-16-2007-- NO CPAP RECOMMENDED    Family Hx:  Family History  Problem Relation Age of Onset  . Diabetes Mother   .  Heart failure Mother   . Diabetes Father     Vitals:  Blood pressure 120/74, pulse 77, temperature 97.6 F (36.4 C), temperature source Oral, resp. rate 18, height 5\' 5"  (1.651 m), weight 230 lb 8 oz (104.554 kg), SpO2 98 %.  Physical Exam: Well-nourished well-developed female in no acute distress.  Neck: Supple, no lymphadenopathy, no thyromegaly.  Lungs: Clear to auscultation bilateral.  Cardiovascular: Regular rate and rhythm.  Abdomen morbidly obese. Soft, nontender nondistended no palpable masses or hepatomegaly.  Groins: No lymphadenopathy.  Extremities: Wrap on her right wrist.  Pelvic: No evidence of recurrent vulvar cancer or dysplasia. She does have several small abscesses consistent with hidradenitis. There is no areas of fluctuance or induration. There is no erythema or warmth.   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 Her recent wide local excision revealed VIN-III with negative margins. Additionally, she was having irregular bleeding. An endometrial biopsy revealed benign inactive endometrium. She return to see me in 12 months. In the interim, she will follow-up with Dr.  Ouida Sills. She was encouraged and congratulated on her weight loss and encouraged to reconsider smoking cessation Adithi Gammon A., MD 06/06/2015, 3:55 PM

## 2015-06-07 ENCOUNTER — Ambulatory Visit: Payer: 59 | Admitting: Gynecologic Oncology

## 2015-10-11 ENCOUNTER — Ambulatory Visit (INDEPENDENT_AMBULATORY_CARE_PROVIDER_SITE_OTHER): Payer: 59 | Admitting: Podiatry

## 2015-10-11 ENCOUNTER — Encounter: Payer: Self-pay | Admitting: Podiatry

## 2015-10-11 ENCOUNTER — Ambulatory Visit (INDEPENDENT_AMBULATORY_CARE_PROVIDER_SITE_OTHER): Payer: 59

## 2015-10-11 DIAGNOSIS — M2041 Other hammer toe(s) (acquired), right foot: Secondary | ICD-10-CM

## 2015-10-11 DIAGNOSIS — M779 Enthesopathy, unspecified: Secondary | ICD-10-CM | POA: Diagnosis not present

## 2015-10-11 DIAGNOSIS — M79675 Pain in left toe(s): Secondary | ICD-10-CM | POA: Diagnosis not present

## 2015-10-11 DIAGNOSIS — M79674 Pain in right toe(s): Secondary | ICD-10-CM | POA: Diagnosis not present

## 2015-10-11 MED ORDER — TRIAMCINOLONE ACETONIDE 10 MG/ML IJ SUSP
10.0000 mg | Freq: Once | INTRAMUSCULAR | Status: AC
  Administered 2015-10-11: 10 mg

## 2015-10-11 NOTE — Progress Notes (Signed)
   Subjective:    Patient ID: Ariel Stewart, female    DOB: [birth year], 49 y.o.   MRN: [id number]  HPI  PT STATED RT FOOT 5TH TOE IS BEEN SORE FOR 2 WEEKS. TOE IS GETTING SORE ESPECIALLY WHEN WEARING SHOES. TRIED TREATMENT.  Review of Systems  Skin: Positive for color change.  All other systems reviewed and are negative.      Objective:   Physical Exam        Assessment & Plan:

## 2015-10-14 NOTE — Progress Notes (Signed)
Subjective:     Patient ID: Ariel Stewart, female   DOB: 1965-11-30, 49 y.o.   MRN: [id number]  HPI patient states that her fifth toe has become very sore with lesion formation and fluid buildup and she cannot wear shoes currently she's tried to soak it and use over-the-counter padding   Review of Systems  All other systems reviewed and are negative.      Objective:   Physical Exam  Constitutional: She is oriented to person, place, and time.  Cardiovascular: Intact distal pulses.   Musculoskeletal: Normal range of motion.  Neurological: She is oriented to person, place, and time.  Skin: Skin is warm.  Nursing note and vitals reviewed.  neurovascular status intact muscle strength adequate range of motion within normal limits with patient found to have good digital perfusion and well oriented 3. Found to have inflammation at the head of the proximal phalanx fifth digit right with keratotic lesion formation and rotation of the toe noted     Assessment:     Toe deformity with inflammatory interphalangeal joint capsulitis and keratotic lesion formation fifth right    Plan:     H&P and x-rays and condition discussed with patient. Today proximal nerve block 60 mg Xylocaine Marcaine mixture was divided and then under sterile technique I did a careful interphalangeal joint injection 2 mg [place] some Kenalog did deep debridement of lesion and applied padding and advised on wider shoes. If symptoms persist we'll need to consider hammertoe arthroplasty procedure

## 2015-10-31 ENCOUNTER — Ambulatory Visit (INDEPENDENT_AMBULATORY_CARE_PROVIDER_SITE_OTHER): Payer: 59 | Admitting: Podiatry

## 2015-10-31 ENCOUNTER — Encounter: Payer: Self-pay | Admitting: Podiatry

## 2015-10-31 VITALS — BP 125/87 | HR 83 | Resp 16

## 2015-10-31 DIAGNOSIS — M2041 Other hammer toe(s) (acquired), right foot: Secondary | ICD-10-CM

## 2015-10-31 NOTE — Patient Instructions (Signed)
Pre-Operative Instructions  Congratulations, you have decided to take an important step to improving your quality of life.  You can be assured that the doctors of [facility] will be with you every step of the way.  1. Plan to be at the surgery center/hospital at least 1 (one) hour prior to your scheduled time unless otherwise directed by the surgical center/hospital staff.  You must have a responsible adult accompany you, remain during the surgery and drive you home.  Make sure you have directions to the surgical center/hospital and know how to get there on time. 2. For hospital based surgery you will need to obtain a history and physical form from your family physician within 1 month prior to the date of surgery- we will give you a form for you primary physician.  3. We make every effort to accommodate the date you request for surgery.  There are however, times where surgery dates or times have to be moved.  We will contact you as soon as possible if a change in schedule is required.   4. No Aspirin/Ibuprofen for one week before surgery.  If you are on aspirin, any non-steroidal anti-inflammatory medications (Mobic, Aleve, Ibuprofen) you should stop taking it 7 days prior to your surgery.  You make take Tylenol  For pain prior to surgery.  5. Medications- If you are taking daily heart and blood pressure medications, seizure, reflux, allergy, asthma, anxiety, pain or diabetes medications, make sure the surgery center/hospital is aware before the day of surgery so they may notify you which medications to take or avoid the day of surgery. 6. No food or drink after midnight the night before surgery unless directed otherwise by surgical center/hospital staff. 7. No alcoholic beverages 24 hours prior to surgery.  No smoking 24 hours prior to or 24 hours after surgery. 8. Wear loose pants or shorts- loose enough to fit over bandages, boots, and casts. 9. No slip on shoes, sneakers are best. 10. Bring  your boot with you to the surgery center/hospital.  Also bring crutches or a walker if your physician has prescribed it for you.  If you do not have this equipment, it will be provided for you after surgery. 11. If you have not been contracted by the surgery center/hospital by the day before your surgery, call to confirm the date and time of your surgery. 12. Leave-time from work may vary depending on the type of surgery you have.  Appropriate arrangements should be made prior to surgery with your employer. 13. Prescriptions will be provided immediately following surgery by your doctor.  Have these filled as soon as possible after surgery and take the medication as directed. 14. Remove nail polish on the operative foot. 15. Wash the night before surgery.  The night before surgery wash the foot and leg well with the antibacterial soap provided and water paying special attention to beneath the toenails and in between the toes.  Rinse thoroughly with water and dry well with a towel.  Perform this wash unless told not to do so by your physician.  Enclosed: 1 Ice pack (please put in freezer the night before surgery)   1 Hibiclens skin cleaner   Pre-op Instructions  If you have any questions regarding the instructions, do not hesitate to call our office.  [place]: [address] , [place] [address] [phone]  [place]: [address] [place], [place] [address] [phone]  [place]: [address]  [place], [place] [address] [phone]  Dr. Richard   Tuchman DPM, Dr. Harmonee Tozer DPM Dr. Richard Sikora DPM, Dr. M. Todd Hyatt DPM, Dr. Kathryn Egerton DPM 

## 2015-11-01 NOTE — Progress Notes (Signed)
Subjective:     Patient ID: Ariel Stewart, female   DOB: 06-May-1966, 49 y.o.   MRN: [id number]  HPI patient states this fifth toe is still driving me crazy and making it hard to wear shoe gear comfortably. 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 Patient wants this fixed and I explained the procedure and allow patient to read consent form going over alternative treatments complications with surgery. She signs consent form after extensive review and is given all preoperative instructions and is scheduled for outpatient surgery [facility]. Encouraged to call with questions prior to procedure  X-ray report indicates enlargement of the head of the proximal phalanx fifth digit right foot with rotational component to the toe

## 2015-11-02 ENCOUNTER — Telehealth: Payer: Self-pay | Admitting: *Deleted

## 2015-11-02 NOTE — Telephone Encounter (Signed)
Authorization was obtained for surgery scheduled for 11/13/2015 for cpt code 28285 by [facility].  The authorization number is [id number].  Authorization was faxed to [facility] at [facility].

## 2015-11-13 DIAGNOSIS — M2041 Other hammer toe(s) (acquired), right foot: Secondary | ICD-10-CM | POA: Diagnosis not present

## 2015-11-23 ENCOUNTER — Ambulatory Visit (INDEPENDENT_AMBULATORY_CARE_PROVIDER_SITE_OTHER): Payer: 59 | Admitting: Podiatry

## 2015-11-23 ENCOUNTER — Ambulatory Visit: Payer: 59

## 2015-11-23 DIAGNOSIS — Z9889 Other specified postprocedural states: Secondary | ICD-10-CM

## 2015-11-23 DIAGNOSIS — M2041 Other hammer toe(s) (acquired), right foot: Secondary | ICD-10-CM | POA: Diagnosis not present

## 2015-11-23 NOTE — Progress Notes (Signed)
Subjective:     Patient ID: Ariel Stewart, female   DOB: 12-30-1965, 50 y.o.   MRN: [id number]  HPI patient states I'm doing well with my right foot with minimal discomfort   Review of Systems     Objective:   Physical Exam Neurovascular status intact wound edges coapted well right fifth toe with good alignment of the digit noted    Assessment:     Doing well post arthroplasty digit 5 right    Plan:     Instructed on physical therapy wider shoe and patient will reappoint for suture removal in 2 weeks or earlier if needed

## 2015-12-07 ENCOUNTER — Ambulatory Visit (INDEPENDENT_AMBULATORY_CARE_PROVIDER_SITE_OTHER): Payer: 59 | Admitting: Podiatry

## 2015-12-07 DIAGNOSIS — M2041 Other hammer toe(s) (acquired), right foot: Secondary | ICD-10-CM

## 2015-12-07 DIAGNOSIS — Z9889 Other specified postprocedural states: Secondary | ICD-10-CM

## 2015-12-10 NOTE — Progress Notes (Signed)
Patient ID: Ariel Stewart, female   DOB: [birth year], 50 y.o.   MRN: [id number]  Subjective: Ariel Stewart is a 50 y.o. is seen today in office s/p right 5th digit PIPJ arthroplasty with Dr. Paulla Dolly. She presents a procedure removal. She is not having any pain and she has been doing well. She has been continuing with a surgical shoe. Denies any systemic complaints such as fevers, chills, nausea, vomiting. No calf pain, chest pain, shortness of breath.   Objective: General: No acute distress, AAOx3  DP/PT pulses palpable 2/4, CRT < 3 sec to all digits.  Protective sensation intact. Motor function intact.  Right foot: Incision is well coapted without any evidence of dehiscence and sutures intact. There is no surrounding erythema, ascending cellulitis, fluctuance, crepitus, malodor, drainage/purulence. There is trace edema around the surgical site. There is no pain along the surgical site.  No other areas of tenderness to bilateral lower extremities.  No other open lesions or pre-ulcerative lesions.  No pain with calf compression, swelling, warmth, erythema.   Assessment and Plan:  Status post right 5th toe repair, doing well with no complications   -Treatment options discussed including all alternatives, risks, and complications -Sutures removed today without complications. Can start to shower tomorrow. Antibiotic ointment and a bandage. -She can start to transition to a regular shoe as tolerated and should not put any pressure on the incision. -Ice/elevation -Pain medication as needed. -Monitor for any clinical signs or symptoms of infection and DVT/PE and directed to call the office immediately should any occur or go to the ER. -Follow-up in 4 weeks if there are any issues or sooner if any problems arise. In the meantime, encouraged to call the office with any questions, concerns, change in symptoms.   Celesta Gentile, DPM

## 2016-01-09 ENCOUNTER — Other Ambulatory Visit: Payer: Self-pay | Admitting: Obstetrics and Gynecology

## 2016-01-10 LAB — CYTOLOGY - PAP

## 2016-01-11 ENCOUNTER — Other Ambulatory Visit: Payer: 59

## 2016-06-25 ENCOUNTER — Emergency Department (HOSPITAL_COMMUNITY): Payer: Worker's Compensation

## 2016-06-25 ENCOUNTER — Encounter (HOSPITAL_COMMUNITY): Payer: Self-pay | Admitting: Emergency Medicine

## 2016-06-25 ENCOUNTER — Emergency Department (HOSPITAL_COMMUNITY)
Admission: EM | Admit: 2016-06-25 | Discharge: 2016-06-25 | Disposition: A | Discharge status: Home / Self Care | Payer: Worker's Compensation | Attending: Emergency Medicine | Admitting: Emergency Medicine

## 2016-06-25 DIAGNOSIS — J45909 Unspecified asthma, uncomplicated: Secondary | ICD-10-CM | POA: Insufficient documentation

## 2016-06-25 DIAGNOSIS — W1839XA Other fall on same level, initial encounter: Secondary | ICD-10-CM | POA: Insufficient documentation

## 2016-06-25 DIAGNOSIS — M1712 Unilateral primary osteoarthritis, left knee: Secondary | ICD-10-CM | POA: Diagnosis not present

## 2016-06-25 DIAGNOSIS — Y999 Unspecified external cause status: Secondary | ICD-10-CM | POA: Insufficient documentation

## 2016-06-25 DIAGNOSIS — E119 Type 2 diabetes mellitus without complications: Secondary | ICD-10-CM | POA: Diagnosis not present

## 2016-06-25 DIAGNOSIS — Y929 Unspecified place or not applicable: Secondary | ICD-10-CM | POA: Diagnosis not present

## 2016-06-25 DIAGNOSIS — M25562 Pain in left knee: Secondary | ICD-10-CM

## 2016-06-25 DIAGNOSIS — Y9389 Activity, other specified: Secondary | ICD-10-CM | POA: Diagnosis not present

## 2016-06-25 DIAGNOSIS — W19XXXA Unspecified fall, initial encounter: Secondary | ICD-10-CM

## 2016-06-25 DIAGNOSIS — Z79899 Other long term (current) drug therapy: Secondary | ICD-10-CM | POA: Insufficient documentation

## 2016-06-25 DIAGNOSIS — S80211A Abrasion, right knee, initial encounter: Secondary | ICD-10-CM

## 2016-06-25 DIAGNOSIS — S8991XA Unspecified injury of right lower leg, initial encounter: Secondary | ICD-10-CM | POA: Diagnosis present

## 2016-06-25 DIAGNOSIS — S81012A Laceration without foreign body, left knee, initial encounter: Secondary | ICD-10-CM | POA: Insufficient documentation

## 2016-06-25 DIAGNOSIS — I10 Essential (primary) hypertension: Secondary | ICD-10-CM | POA: Insufficient documentation

## 2016-06-25 DIAGNOSIS — F17210 Nicotine dependence, cigarettes, uncomplicated: Secondary | ICD-10-CM | POA: Insufficient documentation

## 2016-06-25 MED ORDER — HYDROCODONE-ACETAMINOPHEN 5-325 MG PO TABS
1.0000 | ORAL_TABLET | Freq: Once | ORAL | Status: AC
  Administered 2016-06-25: 1 via ORAL
  Filled 2016-06-25: qty 1

## 2016-06-25 MED ORDER — BACITRACIN ZINC 500 UNIT/GM EX OINT
TOPICAL_OINTMENT | CUTANEOUS | Status: AC
  Administered 2016-06-25: 15:00:00
  Filled 2016-06-25: qty 0.9

## 2016-06-25 MED ORDER — HYDROCODONE-ACETAMINOPHEN 5-325 MG PO TABS: 1.0000 | ORAL_TABLET | Freq: Four times a day (QID) | ORAL | 0 refills | Status: DC | PRN

## 2016-06-25 MED ORDER — NAPROXEN 500 MG PO TABS: 500.0000 mg | ORAL_TABLET | Freq: Two times a day (BID) | ORAL | 0 refills | Status: DC | PRN

## 2016-06-25 NOTE — ED Provider Notes (Signed)
[facility] DEPT Provider Note   CSN: [id number] Arrival date & time: 06/25/16  1151  By signing my name below, I, [name], attest that this documentation has been prepared under the direction and in the presence of non-physician practitioner, [address], PA-C. Electronically Signed: [name], Scribe. 06/25/2016. 1:08 PM.   History   Chief Complaint Chief Complaint  Patient presents with  . Fall  . Knee Pain   The history is provided by the patient. No language interpreter was used.  Fall  This is a new problem. The current episode started 1 to 2 hours ago. The problem occurs rarely. The problem has not changed since onset.Pertinent negatives include no chest pain, no abdominal pain, no headaches and no shortness of breath. The symptoms are aggravated by walking. Nothing relieves the symptoms. She has tried nothing for the symptoms. The treatment provided no relief.  Knee Pain   Pertinent negatives include no numbness.   HPI Comments: Ariel Stewart is a 50 y.o. female with a PMHx of DM2 (diet-controlled) and arthritis, who presents to the Emergency Department complaining of a mechanical fall that occurred abut 2 hours ago (11:30am). She states that her L knee "gave out" and she hit her left knee on concrete, and also hit her head on the back of it. She denies any LOC or headache. She reports having 7/10 constant throbbing non-radiating left knee pain, exacerbated by movement, and with no tx tried PTA. Associated symptoms include knee swelling, bruising, and an abrasion to the knee. She reports having tingling in her left foot, which she attributes to how she is positioned in the chair. She states that her tetanus is UTD from 2014.  She has had issues with this knee before, reports that her surgeon, Dr. Percell Miller, has done 2 scopes on her knee and advised to have a partial replacement in the future.   She denies any numbness, focal weakness, saddle anesthesia or cauda  equina symptoms, bowel/bladder incontinence, neck pain, back pain, HA, vision changes, chest pain, SOB, abdominal pain, nausea, vomiting, headache, or any other symptoms.   PCP: Nena Polio, NP  Past Medical History:  Diagnosis Date  . Arthritis   . Asthma   . Complication of anesthesia    PT STATES SEVERE ITCHING POST-OP-- REQUEST ANTIHISTAMINE   . GERD (gastroesophageal reflux disease)   . History of adjustable gastric banding   . 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 History of hiatal hernia   . History of hypertension    no issue or meds since gastric banding with wt loss  . History of kidney stones   . History of melanoma excision    BACK --  WIDE EXCISION REMOVAL IN 2010  . Mild obstructive sleep apnea    PER STUDY 10-16-2007-- NO CPAP RECOMMENDED  . Type 2 diabetes, diet controlled ([place])   . VIN III (vulvar intraepithelial neoplasia III)     Patient Active Problem List   Diagnosis Date Noted  . Lapband APS + [facility] repair April 2009 06/30/2012  . Obesity-preop weight 314 10/23/2011  . Melanoma of back-excision of bilateral SLNBx axillary Feb 2010 10/23/2011  . Vulvar cancer ([place]) 10/22/2011    Past Surgical History:  Procedure Laterality Date  . BILATERAL SUPERFICIAL INGUINAL LYMPH NODE DISSECTION AND WIDE LOCAL EXCISION VULVA  06-11-2010  . CARDIAC CATHETERIZATION  01/30/2015   due to cardiac damage seen on stress test  . CESAREAN  SECTION  may 2001 &  sept 2002   BILATERAL TUBAL LIGATION WITH LAST C/S  . CYSTO/  RETOGRADE PYELOGRAM/  LEFT URETERAL STONE EXTRACTION  07-19-2003  . ELBOW DEBRIDEMENT Left 07-27-2008  . EXTRACORPOREAL SHOCK WAVE LITHOTRIPSY  x2 in 2009 // x1 2010  . KNEE ARTHROSCOPY Bilateral left 12-07-2008 & 1988/  right 1989  . LAPAROSCOPIC GASTRIC BANDING  02-14-2008   AND  CLOSURE HIATAL HERNIA  . TONSILLECTOMY  1985  . VULVAR LESION REMOVAL N/A 01/04/2015   Procedure: WIDE LOCAL EXCISION OF RIGHT  VULVA;  Surgeon: Imagene Gurney A. Alycia Rossetti, MD;  Location: [facility];  Service: Gynecology;  Laterality: N/A;  . WIDE EXCISION MELANOMA FROM BACK WITH PRIMARY CLOSURE AND BILATERAL AXILLARY SENTINAL LYMPH NODE DISSECTION  12-19-2008  . WIDE LOCAL EXCISION VULVA  03-19-2010    OB History    No data available       Home Medications    Prior to Admission medications   Medication Sig Start Date End Date Taking? Authorizing Provider  ALPRAZolam Duanne Moron) 1 MG tablet Take 1 mg by mouth 2 (two) times daily as needed.  07/28/11   Historical Provider, MD  citalopram (CELEXA) 40 MG tablet Take 40 mg by mouth every evening.     Historical Provider, MD  mupirocin ointment (BACTROBAN) 2 % Apply topically. 05/21/15   Historical Provider, MD  omeprazole (PRILOSEC) 20 MG capsule Take 20 mg by mouth every evening.  07/24/11   Historical Provider, MD  SUMAtriptan (IMITREX) 25 MG tablet Take 25 mg by mouth every 2 (two) hours as needed for migraine or headache. May repeat in 2 hours if headache persists or recurs.    Historical Provider, MD  VENTOLIN HFA 108 (90 BASE) MCG/ACT inhaler Inhale 1 puff into the lungs as needed.  07/14/11   Historical Provider, MD  Vitamin D, Ergocalciferol, (DRISDOL) 50000 UNITS CAPS capsule Take 50,000 Units by mouth 2 (two) times a week.    Historical Provider, MD    Family History Family History  Problem Relation Age of Onset  . Diabetes Mother   . Heart failure Mother   . Diabetes Father     Social History Social History  Substance Use Topics  . Smoking status: Current Every Day Smoker    Packs/day: 0.50    Years: 30.00    Types: Cigarettes  . Smokeless tobacco: Never Used  . Alcohol use Yes     Comment: rare     Allergies   Sulfa antibiotics; Wellbutrin [bupropion]; and Penicillins   Review of Systems Review of Systems  Eyes: Negative for visual disturbance.  Respiratory: Negative for shortness of breath.   Cardiovascular: Negative for chest pain.    Gastrointestinal: Negative for abdominal pain, nausea and vomiting.  Genitourinary: Negative for difficulty urinating (no incontinence).  Musculoskeletal: Positive for arthralgias and joint swelling. Negative for back pain, myalgias and neck pain.  Skin: Positive for color change (bruise) and wound (abrasion).  Allergic/Immunologic: Positive for immunocompromised state (DM2 (diet controlled)).  Neurological: Negative for syncope, weakness, light-headedness, numbness and headaches.  Psychiatric/Behavioral: Negative for confusion.  10 Systems reviewed and all are negative for acute change except as noted in the HPI.   Physical Exam Updated Vital Signs BP 127/82   Pulse 90   Temp 98.3 F (36.8 C)   Resp 18   SpO2 96%   Physical Exam  Constitutional: She is oriented to person, place, and time. Vital signs are normal. She appears well-developed and well-nourished.  Non-toxic appearance. No distress.  Afebrile, nontoxic, NAD  HENT:  Head: Normocephalic and atraumatic.  Mouth/Throat: Mucous membranes are normal.  No scalp crepitus or deformities.   Eyes: Conjunctivae and EOM are normal. Right eye exhibits no discharge. Left eye exhibits no discharge.  Neck: Normal range of motion. Neck supple.  Cardiovascular: Normal rate.   Pulmonary/Chest: Effort normal. No respiratory distress.  Abdominal: Normal appearance. She exhibits no distension.  Musculoskeletal:       Left knee: She exhibits decreased range of motion (Due to pain), swelling, ecchymosis and laceration (Abrasion). She exhibits no deformity, normal alignment, no LCL laxity, normal patellar mobility and no MCL laxity. Tenderness found. Medial joint line and lateral joint line tenderness noted.  Left knee with limited ROM due to pain, with mild diffuse joint line TTP,  +swelling, no deformity, with minimal bruising, small abrasion to patella, no warmth, no abnormal alignment or patellar mobility, no varus/valgus laxity, neg anterior  drawer test, no crepitus. NO other focal bony TTP to the remainder of the leg, no hip joint line TTP. All spinal levels non-TTP with no bony step offs or deformities.  Strength and sensation grossly intact, distal pulses intact, compartments soft  Neurological: She is alert and oriented to person, place, and time. She has normal strength. No sensory deficit.  Skin: Skin is warm and dry. Abrasion noted. No rash noted.  Small abrasion on left knee as noted above.   Psychiatric: She has a normal mood and affect. Her behavior is normal.  Nursing note and vitals reviewed.    ED Treatments / Results  DIAGNOSTIC STUDIES: Oxygen Saturation is 96% on RA, normal by my interpretation.    COORDINATION OF CARE: 1:12 PM- Pt advised of plan for treatment, which includes medication and radiology, and pt agrees.  Labs (all labs ordered are listed, but only abnormal results are displayed) Labs Reviewed - No data to display  EKG  EKG Interpretation None       Radiology Dg Knee Complete 4 Views Left  Result Date: 06/25/2016 CLINICAL DATA:  Leg gave out, fell on sidewalk today. Anterior knee pain and laceration. EXAM: LEFT KNEE - COMPLETE 4+ VIEW COMPARISON:  None. FINDINGS: No acute fracture deformity or dislocation. Joint space intact without erosions. Lateral compartment marginal spurring without height loss. Mild patellofemoral and medial compartment narrowing with periarticular sclerosis. No destructive bony lesions. Soft tissue planes are not suspicious. Pretibial phleboliths. IMPRESSION: Ne no acute fracture deformity or dislocation. Mild degenerative change of the knee. Electronically Signed   By: Elon Alas M.D.   On: 06/25/2016 14:14    Procedures Procedures (including critical care time)  Medications Ordered in ED Medications  HYDROcodone-acetaminophen (NORCO/VICODIN) 5-325 MG per tablet 1 tablet (1 tablet Oral Given 06/25/16 1326)     Initial Impression / Assessment and Plan /  ED Course  I have reviewed the triage vital signs and the nursing notes.  Pertinent labs & imaging results that were available during my care of the patient were reviewed by me and considered in my medical decision making (see chart for details).  Clinical Course    50 y.o. female here with L knee pain s/p mechanical fall PTA. Small abrasion to patellar area. Some swelling and slight bruising to knee. NVI with soft compartments. Complains of tingling in leg but states that it's due to the position she is keeping the leg to help with pain. No hip joint line or midline spinal TTP. Doubt need for other emergent imaging,  will obtain xray of L knee. Pt's tetanus is UTD. Will give pain meds and reassess shortly   2:28 PM Xray showing arthritis but no other injury, will apply knee sleeve and give crutches for comfort. Pain meds given. F/up with her orthopedist tomorrow at already scheduled appt. RICE discussed. Wound care discussed. Doubt need for ppx abx. I explained the diagnosis and have given explicit precautions to return to the ER including for any other new or worsening symptoms. The patient understands and accepts the medical plan as it's been dictated and I have answered their questions. Discharge instructions concerning home care and prescriptions have been given. The patient is STABLE and is discharged to home in good condition.   Final Clinical Impressions(s) / ED Diagnoses   Final diagnoses:  Knee pain, acute, left  Abrasion, knee, right, initial encounter  Fall, initial encounter  Arthritis of knee, left    New Prescriptions New Prescriptions   HYDROCODONE-ACETAMINOPHEN (NORCO) 5-325 MG TABLET    Take 1 tablet by mouth every 6 (six) hours as needed for severe pain.   NAPROXEN (NAPROSYN) 500 MG TABLET    Take 1 tablet (500 mg total) by mouth 2 (two) times daily as needed for mild pain, moderate pain or headache (TAKE WITH MEALS.).   I personally performed the services described in  this documentation, which was scribed in my presence. The recorded information has been reviewed and is accurate.     [address] [place], PA-C 06/25/16 1429    Charlesetta Shanks, MD 07/22/16 [phone]

## 2016-06-25 NOTE — ED Triage Notes (Addendum)
Patient here with complaints of fall today while at work. Complains of left knee pain. Abrasion and swelling noted. CBG 115

## 2016-06-25 NOTE — ED Notes (Signed)
Bed: [facility]4 Expected date:  Expected time:  Means of arrival:  Comments: 

## 2016-06-25 NOTE — Discharge Instructions (Signed)
Wear knee sleeve for at least 2 weeks for stabilization of knee. Use crutches as needed for comfort. Ice and elevate knee throughout the day, using ice pack for no more than 20 minutes every hour. Alternate between naprosyn and norco for pain relief. Do not drive or operate machinery with pain medication use. Keep neosporin and a bandage on the abrasion until healed. Monitor for signs of infection such as drainage, warmth, increasing/spreading redness, or fevers. Follow up with your orthopedic surgeon at your already scheduled appoint for recheck of ongoing knee pain. Return to the ER for changes or worsening symptoms.

## 2016-06-25 NOTE — ED Notes (Signed)
Patient transported to X-ray 

## 2016-06-27 ENCOUNTER — Encounter: Payer: Self-pay | Admitting: *Deleted

## 2016-06-27 NOTE — Progress Notes (Signed)
   DOS 11-13-15  Hammer toe 5th toe right

## 2016-07-02 ENCOUNTER — Ambulatory Visit: Payer: 59 | Attending: Gynecologic Oncology | Admitting: Gynecologic Oncology

## 2016-07-02 ENCOUNTER — Encounter: Payer: Self-pay | Admitting: Gynecologic Oncology

## 2016-07-02 VITALS — BP 126/67 | HR 92 | Temp 97.5°F | Resp 18 | Ht 65.0 in | Wt 220.3 lb

## 2016-07-02 DIAGNOSIS — F329 Major depressive disorder, single episode, unspecified: Secondary | ICD-10-CM | POA: Insufficient documentation

## 2016-07-02 DIAGNOSIS — Z833 Family history of diabetes mellitus: Secondary | ICD-10-CM | POA: Insufficient documentation

## 2016-07-02 DIAGNOSIS — Z888 Allergy status to other drugs, medicaments and biological substances status: Secondary | ICD-10-CM | POA: Insufficient documentation

## 2016-07-02 DIAGNOSIS — F419 Anxiety disorder, unspecified: Secondary | ICD-10-CM | POA: Insufficient documentation

## 2016-07-02 DIAGNOSIS — Z880 Allergy status to penicillin: Secondary | ICD-10-CM | POA: Insufficient documentation

## 2016-07-02 DIAGNOSIS — Z9884 Bariatric surgery status: Secondary | ICD-10-CM | POA: Insufficient documentation

## 2016-07-02 DIAGNOSIS — I10 Essential (primary) hypertension: Secondary | ICD-10-CM | POA: Insufficient documentation

## 2016-07-02 DIAGNOSIS — Z882 Allergy status to sulfonamides status: Secondary | ICD-10-CM | POA: Diagnosis not present

## 2016-07-02 DIAGNOSIS — F17210 Nicotine dependence, cigarettes, uncomplicated: Secondary | ICD-10-CM | POA: Diagnosis not present

## 2016-07-02 DIAGNOSIS — J45909 Unspecified asthma, uncomplicated: Secondary | ICD-10-CM | POA: Insufficient documentation

## 2016-07-02 DIAGNOSIS — Z87442 Personal history of urinary calculi: Secondary | ICD-10-CM | POA: Insufficient documentation

## 2016-07-02 DIAGNOSIS — Z85820 Personal history of malignant melanoma of skin: Secondary | ICD-10-CM | POA: Diagnosis not present

## 2016-07-02 DIAGNOSIS — K219 Gastro-esophageal reflux disease without esophagitis: Secondary | ICD-10-CM | POA: Diagnosis not present

## 2016-07-02 DIAGNOSIS — Z9851 Tubal ligation status: Secondary | ICD-10-CM | POA: Insufficient documentation

## 2016-07-02 DIAGNOSIS — M1990 Unspecified osteoarthritis, unspecified site: Secondary | ICD-10-CM | POA: Insufficient documentation

## 2016-07-02 DIAGNOSIS — N903 Dysplasia of vulva, unspecified: Secondary | ICD-10-CM | POA: Diagnosis present

## 2016-07-02 DIAGNOSIS — K449 Diaphragmatic hernia without obstruction or gangrene: Secondary | ICD-10-CM | POA: Diagnosis not present

## 2016-07-02 DIAGNOSIS — E119 Type 2 diabetes mellitus without complications: Secondary | ICD-10-CM | POA: Insufficient documentation

## 2016-07-02 DIAGNOSIS — G4733 Obstructive sleep apnea (adult) (pediatric): Secondary | ICD-10-CM | POA: Insufficient documentation

## 2016-07-02 DIAGNOSIS — Z8544 Personal history of malignant neoplasm of other female genital organs: Secondary | ICD-10-CM | POA: Diagnosis not present

## 2016-07-02 DIAGNOSIS — Z8249 Family history of ischemic heart disease and other diseases of the circulatory system: Secondary | ICD-10-CM | POA: Diagnosis not present

## 2016-07-02 DIAGNOSIS — F32A Depression, unspecified: Secondary | ICD-10-CM | POA: Insufficient documentation

## 2016-07-02 NOTE — Patient Instructions (Signed)
Plan to follow up with Dr. Ouida Sills in March and Dr. Alycia Rossetti in one year.  Please call after you see Dr. Ouida Sills to schedule your appt with Dr. Alycia Rossetti.

## 2016-07-02 NOTE — Progress Notes (Signed)
Consult Note: Gyn-Onc  [name] 50 y.o. female  CC:  Chief Complaint  Patient presents with  . Vulvar Cancer    follow up visit    HPI: Ms. Widmeyer is a very pleasant 50 year old with a stage I-B vulvar carcinoma. She saw Dr. Ouida Sills initially in May 2011. 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 PET CT revealed no abnormal intake in the vulva. However, there was one external iliac node measuring 1.2 cm that did not have any uptake. She was referred to [place] and subsequently August 2011 she underwent repeat wide local excision and bilateral groin node dissection. There was a 2 cm left groin node that was slightly enlarged but soft and pathology was negative on the right of 0/5 lymph nodes and 0/2 on the left. The vulva revealed focal high-grade dysplasia extending to the margins but there was no residual invasive carcinoma.   Her Pap smear was negative in 2/16 with Dr. Ouida Sills. Her mammogram was also unremarkable. Unfortunately, when Dr. Ouida Sills saw her recently he noticed a lesion on the right posterior fourchette. This was biopsied and returned as VIN 3 with no evidence of invasive carcinoma identified. I saw her in February and performed an endometrial biopsy secondary to bleeding. Her endometrial biopsy revealed benign and inactive endometrium.  She underwent wide local excision in the operating room on January 04, 2015. Pathology revealed high-grade dysplasia VIN 3 with negative margins. There is no invasive carcinoma. She comes in today for follow up.She was seen by Dr. Ouida Sills in March 2017 and had a negative Pap smear at that time. She fell at work and has strained her left knee. She is under the care of an orthopedist will be seeing her in follow-up on September 7. She saw Dr. Ouida Sills she states that  there was no evidence of any vulvar dysplasia or lesions. She herself has no symptoms. She has lost another 10 pounds since we last saw her. She did try cutting back on smoking but secondary to stress she is continuing to smoke about half a pack per day. Her review of systems is otherwise negative.   Current Meds:  Outpatient Encounter Prescriptions as of 07/02/2016  Medication Sig Dispense Refill  . ALPRAZolam (XANAX) 1 MG tablet Take 1 mg by mouth 2 (two) times daily as needed.     . citalopram (CELEXA) 40 MG tablet Take 40 mg by mouth every evening.     . mupirocin ointment (BACTROBAN) 2 % Apply topically.    . naproxen (NAPROSYN) 500 MG tablet Take 1 tablet (500 mg total) by mouth 2 (two) times daily as needed for mild pain, moderate pain or headache (TAKE WITH MEALS.). 20 tablet 0  . omeprazole (PRILOSEC) 20 MG capsule Take 20 mg by mouth every evening.     . SUMAtriptan (IMITREX) 25 MG tablet Take 25 mg by mouth every 2 (two) hours as needed for migraine or headache. May repeat in 2 hours if headache persists or recurs.    . VENTOLIN HFA 108 (90 BASE) MCG/ACT inhaler Inhale 1 puff into the lungs as needed.     . Vitamin D, Ergocalciferol, (DRISDOL) 50000 UNITS CAPS capsule Take 50,000 Units by mouth 2 (two) times a week.    Marland Kitchen HYDROcodone-acetaminophen (NORCO) 5-325 MG tablet Take 1 tablet by mouth every 6 (six)  hours as needed for severe pain. (Patient not taking: Reported on 07/02/2016) 10 tablet 0   No facility-administered encounter medications on file as of 07/02/2016.     Allergy:  Allergies  Allergen Reactions  . Sulfa Antibiotics Swelling    Genital swelling   . Wellbutrin [Bupropion] Other (See Comments)     agaition  . Penicillins Rash    Social Hx:   Social History   Social History  . Marital status: Married    Spouse name: N/A  . Number of children: N/A  . Years of education: N/A   Occupational History  . Not on file.   Social History Main Topics  . Smoking  status: Current Every Day Smoker    Packs/day: 0.50    Years: 30.00    Types: Cigarettes  . Smokeless tobacco: Never Used  . Alcohol use Yes     Comment: rare  . Drug use: No  . Sexual activity: Yes    Birth control/ protection: Surgical   Other Topics Concern  . Not on file   Social History Narrative  . No narrative on file    Past Surgical Hx:  Past Surgical History:  Procedure Laterality Date  . BILATERAL SUPERFICIAL INGUINAL LYMPH NODE DISSECTION AND WIDE LOCAL EXCISION VULVA  06-11-2010  . CARDIAC CATHETERIZATION  01/30/2015   due to cardiac damage seen on stress test  . CESAREAN SECTION  may 2001 &  sept 2002   BILATERAL TUBAL LIGATION WITH LAST C/S  . CYSTO/  RETOGRADE PYELOGRAM/  LEFT URETERAL STONE EXTRACTION  07-19-2003  . ELBOW DEBRIDEMENT Left 07-27-2008  . EXTRACORPOREAL SHOCK WAVE LITHOTRIPSY  x2 in 2009 // x1 2010  . KNEE ARTHROSCOPY Bilateral left 12-07-2008 & 1988/  right 1989  . LAPAROSCOPIC GASTRIC BANDING  02-14-2008   AND  CLOSURE HIATAL HERNIA  . TONSILLECTOMY  1985  . VULVAR LESION REMOVAL N/A 01/04/2015   Procedure: WIDE LOCAL EXCISION OF RIGHT VULVA;  Surgeon: Imagene Gurney A. Alycia Rossetti, MD;  Location: [facility];  Service: Gynecology;  Laterality: N/A;  . WIDE EXCISION MELANOMA FROM BACK WITH PRIMARY CLOSURE AND BILATERAL AXILLARY SENTINAL LYMPH NODE DISSECTION  12-19-2008  . WIDE LOCAL EXCISION VULVA  03-19-2010    Past Medical Hx:  Past Medical History:  Diagnosis Date  . Arthritis   . Asthma   . Complication of anesthesia    PT STATES SEVERE ITCHING POST-OP-- REQUEST ANTIHISTAMINE   . GERD (gastroesophageal reflux disease)   . History of adjustable gastric banding   . 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 History of hiatal hernia   . History of hypertension    no issue or meds since gastric banding with wt loss  . History of kidney stones   . History of melanoma excision    BACK --  WIDE  EXCISION REMOVAL IN 2010  . Mild obstructive sleep apnea    PER STUDY 10-16-2007-- NO CPAP RECOMMENDED  . Type 2 diabetes, diet controlled ([place])   . VIN III (vulvar intraepithelial neoplasia III)     Family Hx:  Family History  Problem Relation Age of Onset  . Diabetes Mother   . Heart failure Mother   . Diabetes Father     Vitals:  Blood pressure 126/67, pulse 92, temperature 97.5 F (36.4 C), temperature source Oral, resp. rate 18, height 5\' 5"  (1.651 m), weight 220 lb 4.8 oz (99.9 kg), SpO2 94 %.  Physical Exam: Well-nourished well-developed female in no acute distress.  Neck: Supple, no lymphadenopathy, no thyromegaly.  Lungs: Clear to auscultation bilateral.  Cardiovascular: Regular rate and rhythm.  Abdomen: Morbidly obese. Soft, nontender nondistended no palpable masses or hepatomegaly.  Groins: No lymphadenopathy.  Extremities: Wrap on her left knee  Pelvic: No evidence of recurrent vulvar cancer or dysplasia.  No visible lesions or hyperkeratosis. She does have 1 prominent hemorrhoid. There is no active bleeding.   Assessment/Plan:  50 year old with history of stage IB vulvar carcinoma who clinically has no evidence of recurrent cancer, but did have recurrent vulvar dysplasia. Her recent wide local excision revealed VIN-III with negative margins. She return to see me in 12 months per her request. In the interim, she will follow-up with Dr. Ouida Sills in March 2018. She was encouraged and congratulated on her weight loss and encouraged to reconsider smoking cessation. She will follow-up with her other physicians as scheduled.  Nancy Marus A., MD 07/02/2016, 2:38 PM

## 2016-07-16 ENCOUNTER — Ambulatory Visit: Payer: 59 | Admitting: Gynecologic Oncology

## 2016-09-16 ENCOUNTER — Encounter (HOSPITAL_COMMUNITY): Payer: Self-pay

## 2017-01-02 ENCOUNTER — Emergency Department (HOSPITAL_COMMUNITY)
Admission: EM | Admit: 2017-01-02 | Discharge: 2017-01-03 | Disposition: A | Discharge status: Home / Self Care | Payer: 59 | Attending: Emergency Medicine | Admitting: Emergency Medicine

## 2017-01-02 ENCOUNTER — Encounter (HOSPITAL_COMMUNITY): Payer: Self-pay | Admitting: Emergency Medicine

## 2017-01-02 DIAGNOSIS — Z4651 Encounter for fitting and adjustment of gastric lap band: Secondary | ICD-10-CM | POA: Diagnosis not present

## 2017-01-02 DIAGNOSIS — E876 Hypokalemia: Secondary | ICD-10-CM | POA: Insufficient documentation

## 2017-01-02 DIAGNOSIS — R112 Nausea with vomiting, unspecified: Secondary | ICD-10-CM | POA: Diagnosis not present

## 2017-01-02 DIAGNOSIS — E119 Type 2 diabetes mellitus without complications: Secondary | ICD-10-CM | POA: Insufficient documentation

## 2017-01-02 DIAGNOSIS — R630 Anorexia: Secondary | ICD-10-CM | POA: Diagnosis present

## 2017-01-02 DIAGNOSIS — Z8544 Personal history of malignant neoplasm of other female genital organs: Secondary | ICD-10-CM | POA: Diagnosis not present

## 2017-01-02 DIAGNOSIS — J45909 Unspecified asthma, uncomplicated: Secondary | ICD-10-CM | POA: Insufficient documentation

## 2017-01-02 DIAGNOSIS — R319 Hematuria, unspecified: Secondary | ICD-10-CM | POA: Diagnosis not present

## 2017-01-02 DIAGNOSIS — I10 Essential (primary) hypertension: Secondary | ICD-10-CM | POA: Diagnosis not present

## 2017-01-02 DIAGNOSIS — F17210 Nicotine dependence, cigarettes, uncomplicated: Secondary | ICD-10-CM | POA: Insufficient documentation

## 2017-01-02 LAB — COMPREHENSIVE METABOLIC PANEL
ALT: 10 U/L — ABNORMAL LOW (ref 14–54)
AST: 13 U/L — ABNORMAL LOW (ref 15–41)
Albumin: 4.2 g/dL (ref 3.5–5.0)
Alkaline Phosphatase: 63 U/L (ref 38–126)
Anion gap: 9 (ref 5–15)
BUN: 13 mg/dL (ref 6–20)
CHLORIDE: 101 mmol/L (ref 101–111)
CO2: 27 mmol/L (ref 22–32)
CREATININE: 0.72 mg/dL (ref 0.44–1.00)
Calcium: 9.1 mg/dL (ref 8.9–10.3)
Glucose, Bld: 90 mg/dL (ref 65–99)
POTASSIUM: 3.2 mmol/L — AB (ref 3.5–5.1)
Sodium: 137 mmol/L (ref 135–145)
Total Bilirubin: 0.6 mg/dL (ref 0.3–1.2)
Total Protein: 7.2 g/dL (ref 6.5–8.1)

## 2017-01-02 LAB — URINALYSIS, ROUTINE W REFLEX MICROSCOPIC
Bacteria, UA: NONE SEEN
GLUCOSE, UA: NEGATIVE mg/dL
HGB URINE DIPSTICK: NEGATIVE
Ketones, ur: 20 mg/dL — AB
Nitrite: NEGATIVE
PH: 5 (ref 5.0–8.0)
PROTEIN: 30 mg/dL — AB
Specific Gravity, Urine: 1.034 — ABNORMAL HIGH (ref 1.005–1.030)

## 2017-01-02 LAB — CBC
HEMATOCRIT: 40.3 % (ref 36.0–46.0)
Hemoglobin: 13.4 g/dL (ref 12.0–15.0)
MCH: 28.8 pg (ref 26.0–34.0)
MCHC: 33.3 g/dL (ref 30.0–36.0)
MCV: 86.5 fL (ref 78.0–100.0)
PLATELETS: 283 10*3/uL (ref 150–400)
RBC: 4.66 MIL/uL (ref 3.87–5.11)
RDW: 14 % (ref 11.5–15.5)
WBC: 8.3 10*3/uL (ref 4.0–10.5)

## 2017-01-02 LAB — LIPASE, BLOOD: LIPASE: 18 U/L (ref 11–51)

## 2017-01-02 MED ORDER — SODIUM CHLORIDE 0.9 % IV SOLN
30.0000 meq | Freq: Once | INTRAVENOUS | Status: DC
  Administered 2017-01-02: 30 meq via INTRAVENOUS
  Filled 2017-01-02: qty 15

## 2017-01-02 MED ORDER — POTASSIUM CHLORIDE CRYS ER 20 MEQ PO TBCR
40.0000 meq | EXTENDED_RELEASE_TABLET | Freq: Once | ORAL | Status: AC
  Administered 2017-01-03: 40 meq via ORAL
  Filled 2017-01-02: qty 2

## 2017-01-02 MED ORDER — SODIUM CHLORIDE 0.9 % IV BOLUS (SEPSIS)
2000.0000 mL | Freq: Once | INTRAVENOUS | Status: AC
  Administered 2017-01-02: 2000 mL via INTRAVENOUS

## 2017-01-02 NOTE — ED Triage Notes (Signed)
Pt verbalizes gastric band tightened on 2/1 and over past 3 days minimal intake related to "throwing up even with a sip of water." Denies pain.

## 2017-01-02 NOTE — ED Notes (Signed)
Pt taken to collect urine specimen.

## 2017-01-02 NOTE — Consult Note (Addendum)
[place], MD,  [address]  [place], [place]    [place] Phone:  [phone] FAX:  [phone]   Re:   Ariel Stewart DOB:   11-22-1965 MRN:   [id number]  ASSESSMENT AND PLAN: 1.  Lap band too tight  I removed 4 cc, leaving 4 cc in the lap band  She was able to drink fluid after I removed fluid from the lap band.  She has follow up with Dr. Hassell Done on 01/12/2017.  2.  History of lap band - 02/14/2008 - Dr. Hassell Done  HISTORY OF PRESENT ILLNESS: Chief Complaint  Patient presents with  . Decreased Appetite    Ariel Stewart is a 51 y.o. (DOB: 03-20-1966)  white female who is a patient of GIBSON,TIFFANY, NP. Her husband Chrissie Noa is with her.  She had a lap band fill by Dr. Hassell Done on 12/04/2016.  She did well the first couple of weeks - but then had more trouble keeping liquids down.  She got to the point she cannot even keep water down. She called our office as it was closing today.  [facility].. She is unsure of the volume in her lap band.  Past Medical History:  Diagnosis Date  . Arthritis   . Asthma   . Complication of anesthesia    PT STATES SEVERE ITCHING POST-OP-- REQUEST ANTIHISTAMINE   . GERD (gastroesophageal reflux disease)   . History of adjustable gastric banding   . 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 History of hiatal hernia   . History of hypertension    no issue or meds since gastric banding with wt loss  . History of kidney stones   . History of melanoma excision    BACK --  WIDE EXCISION REMOVAL IN 2010  . Mild obstructive sleep apnea    PER STUDY 10-16-2007-- NO CPAP RECOMMENDED  . Type 2 diabetes, diet controlled ([place])   . VIN III (vulvar intraepithelial neoplasia III)     SOCIAL HISTORY: Her husband Chrissie Noa is with her.  PHYSICAL EXAM: BP 120/68 (BP Location: Left Arm)   Pulse 74   Temp 98.7 F (37.1 C) (Oral)   Resp 18    Ht 5\' 5"  (1.651 m)   Wt 92.8 kg (204 lb 8 oz)   SpO2 95%   BMI 34.03 kg/m   Abdomen - soft. BS present.  Lap band port in RUQ.  I accessed her lap band - she had 8.0 cc in the lap band (about 1.0 cc was under pressure).  I removed 4.0 cc from the lap band - leaving 4.0 cc in the lap band.  She drank water without difficulty after I removed the fluid from the lap band.  DATA REVIEWED: Epic notes.   Alphonsa Overall, MD,  [facility], [address]  [place], [place]    [place] Phone:  [phone] FAX:  [phone]

## 2017-01-02 NOTE — ED Provider Notes (Addendum)
[facility] DEPT Provider Note   CSN: [id number] Arrival date & time: 01/02/17  1732     History   Chief Complaint Chief Complaint  Patient presents with  . Decreased Appetite    HPI Ariel Stewart is a 51 y.o. female.Patient reports multiple episodes of vomiting since her gastric banding was tightened on 12/04/2016. She denies pain anywhere last bowel movement approximately a week ago however she continues to pass gas per rectum. Denies fever. No treatment prior to coming here.S He reports that today she vomits several minutes after drinking water. She feels dehydrated nothing makes symptoms better or worse. No other associated symptoms. She did call [facility] earlier today was advised to come the emergency Department HPI  Past Medical History:  Diagnosis Date  . Arthritis   . Asthma   . Complication of anesthesia    PT STATES SEVERE ITCHING POST-OP-- REQUEST ANTIHISTAMINE   . GERD (gastroesophageal reflux disease)   . History of adjustable gastric banding   . 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 History of hiatal hernia   . History of hypertension    no issue or meds since gastric banding with wt loss  . History of kidney stones   . History of melanoma excision    BACK --  WIDE EXCISION REMOVAL IN 2010  . Mild obstructive sleep apnea    PER STUDY 10-16-2007-- NO CPAP RECOMMENDED  . Type 2 diabetes, diet controlled ([place])   . VIN III (vulvar intraepithelial neoplasia III)     Patient Active Problem List   Diagnosis Date Noted  . Anxiety 07/02/2016  . Asthma 07/02/2016  . Depression 07/02/2016  . Hypertension 07/02/2016  . Lapband APS + [facility] repair April 2009 06/30/2012  . Obesity-preop weight 314 10/23/2011  . Melanoma of back-excision of bilateral SLNBx axillary Feb 2010 10/23/2011  . Vulvar cancer ([place]) 10/22/2011  . Diabetes mellitus type 2, uncomplicated ([place]) [id number]    Past Surgical History:    Procedure Laterality Date  . BILATERAL SUPERFICIAL INGUINAL LYMPH NODE DISSECTION AND WIDE LOCAL EXCISION VULVA  06-11-2010  . CARDIAC CATHETERIZATION  01/30/2015   due to cardiac damage seen on stress test  . CESAREAN SECTION  may 2001 &  sept 2002   BILATERAL TUBAL LIGATION WITH LAST C/S  . CYSTO/  RETOGRADE PYELOGRAM/  LEFT URETERAL STONE EXTRACTION  07-19-2003  . ELBOW DEBRIDEMENT Left 07-27-2008  . EXTRACORPOREAL SHOCK WAVE LITHOTRIPSY  x2 in 2009 // x1 2010  . KNEE ARTHROSCOPY Bilateral left 12-07-2008 & 1988/  right 1989  . LAPAROSCOPIC GASTRIC BANDING  02-14-2008   AND  CLOSURE HIATAL HERNIA  . TONSILLECTOMY  1985  . VULVAR LESION REMOVAL N/A 01/04/2015   Procedure: WIDE LOCAL EXCISION OF RIGHT VULVA;  Surgeon: Imagene Gurney A. Alycia Rossetti, MD;  Location: [facility];  Service: Gynecology;  Laterality: N/A;  . WIDE EXCISION MELANOMA FROM BACK WITH PRIMARY CLOSURE AND BILATERAL AXILLARY SENTINAL LYMPH NODE DISSECTION  12-19-2008  . WIDE LOCAL EXCISION VULVA  03-19-2010    OB History    No data available       Home Medications    Prior to Admission medications   Medication Sig Start Date End Date Taking? Authorizing Provider  ALPRAZolam Duanne Moron) 1 MG tablet Take 1 mg by mouth 2 (two) times daily as needed for anxiety.  07/28/11  Yes Historical Provider, MD  citalopram (CELEXA) 40 MG tablet Take  40 mg by mouth every evening.    Yes Historical Provider, MD  Cyanocobalamin (VITAMIN B 12 PO) Take 1 tablet by mouth daily.   Yes Historical Provider, MD  mupirocin ointment (BACTROBAN) 2 % Apply 1 application topically daily as needed (yeast under belly).  05/21/15  Yes Historical Provider, MD  omeprazole (PRILOSEC) 20 MG capsule Take 20 mg by mouth every evening.  07/24/11  Yes Historical Provider, MD  SUMAtriptan (IMITREX) 25 MG tablet Take 25 mg by mouth every 2 (two) hours as needed for migraine or headache. May repeat in 2 hours if headache persists or recurs.   Yes Historical  Provider, MD  VENTOLIN HFA 108 (90 BASE) MCG/ACT inhaler Inhale 1 puff into the lungs as needed.  07/14/11  Yes Historical Provider, MD  Vitamin D, Ergocalciferol, (DRISDOL) 50000 UNITS CAPS capsule Take 50,000 Units by mouth 2 (two) times a week. Sunday and Monday   Yes Historical Provider, MD  HYDROcodone-acetaminophen (NORCO) 5-325 MG tablet Take 1 tablet by mouth every 6 (six) hours as needed for severe pain. Patient not taking: Reported on 07/02/2016 06/25/16   Mercedes Street, PA-C  naproxen (NAPROSYN) 500 MG tablet Take 1 tablet (500 mg total) by mouth 2 (two) times daily as needed for mild pain, moderate pain or headache (TAKE WITH MEALS.). Patient not taking: Reported on 01/02/2017 06/25/16   Reece Agar, PA-C    Family History Family History  Problem Relation Age of Onset  . Diabetes Mother   . Heart failure Mother   . Diabetes Father     Social History Social History  Substance Use Topics  . Smoking status: Current Every Day Smoker    Packs/day: 0.50    Years: 30.00    Types: Cigarettes  . Smokeless tobacco: Never Used  . Alcohol use Yes     Comment: rare     Allergies   Sulfa antibiotics; Wellbutrin [bupropion]; and Penicillins   Review of Systems Review of Systems  Constitutional:       Feels dehydrated  Gastrointestinal: Positive for vomiting.  All other systems reviewed and are negative.    Physical Exam Updated Vital Signs BP 120/68 (BP Location: Left Arm)   Pulse 74   Temp 98.7 F (37.1 C) (Oral)   Resp 18   Ht 5\' 5"  (1.651 m)   Wt 204 lb 8 oz (92.8 kg)   SpO2 95%   BMI 34.03 kg/m   Physical Exam  Constitutional: She appears well-developed and well-nourished.  HENT:  Head: Normocephalic and atraumatic.  Eyes: Conjunctivae are normal. Pupils are equal, round, and reactive to light.  Neck: Neck supple. No tracheal deviation present. No thyromegaly present.  Cardiovascular: Normal rate and regular rhythm.   No murmur heard. Pulmonary/Chest:  Effort normal and breath sounds normal.  Abdominal: Soft. Bowel sounds are normal. She exhibits no distension. There is no tenderness.  Obese  Musculoskeletal: Normal range of motion. She exhibits no edema or tenderness.  Neurological: She is alert. Coordination normal.  Skin: Skin is warm and dry. No rash noted.  Psychiatric: She has a normal mood and affect.  Nursing note and vitals reviewed.    ED Treatments / Results  Labs (all labs ordered are listed, but only abnormal results are displayed) Labs Reviewed  COMPREHENSIVE METABOLIC PANEL - Abnormal; Notable for the following:       Result Value   Potassium 3.2 (*)    AST 13 (*)    ALT 10 (*)    All  other components within normal limits  URINALYSIS, ROUTINE W REFLEX MICROSCOPIC - Abnormal; Notable for the following:    Color, Urine AMBER (*)    APPearance CLOUDY (*)    Specific Gravity, Urine 1.034 (*)    Bilirubin Urine MODERATE (*)    Ketones, ur 20 (*)    Protein, ur 30 (*)    Leukocytes, UA TRACE (*)    Squamous Epithelial / LPF 6-30 (*)    All other components within normal limits  LIPASE, BLOOD  CBC    EKG  EKG Interpretation None       Radiology No results found.  Procedures Procedures (including critical care time)  Medications Ordered in ED Medications  sodium chloride 0.9 % bolus 2,000 mL (not administered)  potassium chloride 30 mEq in sodium chloride 0.9 % 265 mL (KCL MULTIRUN) IVPB (not administered)    Results for orders placed or performed during the hospital encounter of 01/02/17  Lipase, blood  Result Value Ref Range   Lipase 18 11 - 51 U/L  Comprehensive metabolic panel  Result Value Ref Range   Sodium 137 135 - 145 mmol/L   Potassium 3.2 (L) 3.5 - 5.1 mmol/L   Chloride 101 101 - 111 mmol/L   CO2 27 22 - 32 mmol/L   Glucose, Bld 90 65 - 99 mg/dL   BUN 13 6 - 20 mg/dL   Creatinine, Ser 0.72 0.44 - 1.00 mg/dL   Calcium 9.1 8.9 - 10.3 mg/dL   Total Protein 7.2 6.5 - 8.1 g/dL    Albumin 4.2 3.5 - 5.0 g/dL   AST 13 (L) 15 - 41 U/L   ALT 10 (L) 14 - 54 U/L   Alkaline Phosphatase 63 38 - 126 U/L   Total Bilirubin 0.6 0.3 - 1.2 mg/dL   GFR calc non Af Amer >60 >60 mL/min   GFR calc Af Amer >60 >60 mL/min   Anion gap 9 5 - 15  CBC  Result Value Ref Range   WBC 8.3 4.0 - 10.5 K/uL   RBC 4.66 3.87 - 5.11 MIL/uL   Hemoglobin 13.4 12.0 - 15.0 g/dL   HCT 40.3 36.0 - 46.0 %   MCV 86.5 78.0 - 100.0 fL   MCH 28.8 26.0 - 34.0 pg   MCHC 33.3 30.0 - 36.0 g/dL   RDW 14.0 11.5 - 15.5 %   Platelets 283 150 - 400 K/uL  Urinalysis, Routine w reflex microscopic  Result Value Ref Range   Color, Urine AMBER (A) YELLOW   APPearance CLOUDY (A) CLEAR   Specific Gravity, Urine 1.034 (H) 1.005 - 1.030   pH 5.0 5.0 - 8.0   Glucose, UA NEGATIVE NEGATIVE mg/dL   Hgb urine dipstick NEGATIVE NEGATIVE   Bilirubin Urine MODERATE (A) NEGATIVE   Ketones, ur 20 (A) NEGATIVE mg/dL   Protein, ur 30 (A) NEGATIVE mg/dL   Nitrite NEGATIVE NEGATIVE   Leukocytes, UA TRACE (A) NEGATIVE   RBC / HPF TOO NUMEROUS TO COUNT 0 - 5 RBC/hpf   WBC, UA 6-30 0 - 5 WBC/hpf   Bacteria, UA NONE SEEN NONE SEEN   Squamous Epithelial / LPF 6-30 (A) NONE SEEN   Mucous PRESENT    Hyaline Casts, UA PRESENT    Ca Oxalate Crys, UA PRESENT    No results found. Initial Impression / Assessment and Plan / ED Course  I have reviewed the triage vital signs and the nursing notes.  Pertinent labs & imaging results that were available during  my care of the patient were reviewed by me and considered in my medical decision making (see chart for details).     Dr. Keturah Barre. [facility] consulted and will come in to evaluate patient. Dr. Lucia Gaskins removed fluid from the lap band and patient was able to drink without vomiting or nausea. She received oral potassium supplementation prior to discharge. She has follow-up appointment scheduled at the office for 01/12/2017 of note patient has microscopic hematuria and states that she may have  chronic hematuria secondary to kidney stones. Suggest urinalysis recheck within the next one or 2 weeks Final Clinical Impressions(s) / ED Diagnoses  Diagnosis #1Encounter for adjustment of lap band #2 hypokalemia Final diagnoses:  None  #3 microscopic hematuria #4 nausea and vomiting  New Prescriptions New Prescriptions   No medications on file     Orlie Dakin, MD 01/02/17 [place], MD 01/02/17 [place], MD 01/02/17 [id number]

## 2017-01-02 NOTE — Discharge Instructions (Signed)
Keep your scheduled appointment at [facility] surgery on 01/12/2017. You have trace microscopic amount of blood in your urine. Ask your primary care physician to recheck your urine within the next one or 2 weeks. If you continue to have blood in your urine you may need to see a urologist. Your primary care physician can refer you. Return if concern for any reason.

## 2017-01-07 ENCOUNTER — Other Ambulatory Visit (HOSPITAL_COMMUNITY): Payer: Self-pay | Admitting: Surgery

## 2017-01-07 DIAGNOSIS — Z6841 Body Mass Index (BMI) 40.0 and over, adult: Principal | ICD-10-CM

## 2017-01-13 ENCOUNTER — Other Ambulatory Visit: Payer: Self-pay | Admitting: Obstetrics and Gynecology

## 2017-01-15 LAB — CYTOLOGY - PAP

## 2017-01-23 ENCOUNTER — Encounter (HOSPITAL_COMMUNITY): Payer: Self-pay

## 2017-01-23 ENCOUNTER — Ambulatory Visit (HOSPITAL_COMMUNITY): Payer: 59

## 2017-03-11 ENCOUNTER — Ambulatory Visit (HOSPITAL_COMMUNITY)
Admission: RE | Admit: 2017-03-11 | Discharge: 2017-03-11 | Disposition: A | Discharge status: Home / Self Care | Payer: 59 | Source: Ambulatory Visit | Attending: Surgery | Admitting: Surgery

## 2017-03-11 DIAGNOSIS — R079 Chest pain, unspecified: Secondary | ICD-10-CM | POA: Diagnosis present

## 2017-03-11 DIAGNOSIS — Z6841 Body Mass Index (BMI) 40.0 and over, adult: Secondary | ICD-10-CM

## 2017-07-13 ENCOUNTER — Telehealth: Payer: Self-pay | Admitting: *Deleted

## 2017-07-13 NOTE — Telephone Encounter (Signed)
Patient called and scheduled an appt for a follow up. Patient scheduled for Wednesday at 8am

## 2017-07-15 ENCOUNTER — Other Ambulatory Visit: Payer: Self-pay | Admitting: Gynecologic Oncology

## 2017-07-15 ENCOUNTER — Other Ambulatory Visit: Payer: 59

## 2017-07-15 ENCOUNTER — Ambulatory Visit: Payer: 59 | Attending: Gynecologic Oncology | Admitting: Gynecologic Oncology

## 2017-07-15 ENCOUNTER — Encounter: Payer: Self-pay | Admitting: Gynecologic Oncology

## 2017-07-15 VITALS — BP 121/58 | HR 65 | Temp 98.5°F | Resp 18 | Wt 232.0 lb

## 2017-07-15 DIAGNOSIS — Z888 Allergy status to other drugs, medicaments and biological substances status: Secondary | ICD-10-CM | POA: Insufficient documentation

## 2017-07-15 DIAGNOSIS — Z8544 Personal history of malignant neoplasm of other female genital organs: Secondary | ICD-10-CM

## 2017-07-15 DIAGNOSIS — K219 Gastro-esophageal reflux disease without esophagitis: Secondary | ICD-10-CM | POA: Insufficient documentation

## 2017-07-15 DIAGNOSIS — Z87891 Personal history of nicotine dependence: Secondary | ICD-10-CM | POA: Insufficient documentation

## 2017-07-15 DIAGNOSIS — Z880 Allergy status to penicillin: Secondary | ICD-10-CM | POA: Insufficient documentation

## 2017-07-15 DIAGNOSIS — Z79899 Other long term (current) drug therapy: Secondary | ICD-10-CM | POA: Insufficient documentation

## 2017-07-15 DIAGNOSIS — Z87442 Personal history of urinary calculi: Secondary | ICD-10-CM | POA: Insufficient documentation

## 2017-07-15 DIAGNOSIS — N903 Dysplasia of vulva, unspecified: Secondary | ICD-10-CM | POA: Diagnosis present

## 2017-07-15 DIAGNOSIS — E119 Type 2 diabetes mellitus without complications: Secondary | ICD-10-CM | POA: Insufficient documentation

## 2017-07-15 DIAGNOSIS — I10 Essential (primary) hypertension: Secondary | ICD-10-CM | POA: Insufficient documentation

## 2017-07-15 DIAGNOSIS — Z87412 Personal history of vulvar dysplasia: Secondary | ICD-10-CM

## 2017-07-15 DIAGNOSIS — N764 Abscess of vulva: Secondary | ICD-10-CM

## 2017-07-15 DIAGNOSIS — L0231 Cutaneous abscess of buttock: Secondary | ICD-10-CM | POA: Diagnosis not present

## 2017-07-15 DIAGNOSIS — J449 Chronic obstructive pulmonary disease, unspecified: Secondary | ICD-10-CM | POA: Diagnosis not present

## 2017-07-15 DIAGNOSIS — G4733 Obstructive sleep apnea (adult) (pediatric): Secondary | ICD-10-CM | POA: Insufficient documentation

## 2017-07-15 DIAGNOSIS — N951 Menopausal and female climacteric states: Secondary | ICD-10-CM

## 2017-07-15 MED ORDER — AMOXICILLIN-POT CLAVULANATE 875-125 MG PO TABS: 1.0000 | ORAL_TABLET | Freq: Two times a day (BID) | ORAL | 0 refills | Status: AC

## 2017-07-15 NOTE — Patient Instructions (Addendum)
Amoxicillin; Clavulanic Acid chewable tablets What is this medicine? AMOXICILLIN; CLAVULANIC ACID (a mox i SIL in; KLAV yoo lan ic AS id) is a penicillin antibiotic. It is used to treat certain kinds of bacterial infections. It It will not work for colds, flu, or other viral infections. This medicine may be used for other purposes; ask your health care provider or pharmacist if you have questions. COMMON BRAND NAME(S): Augmentin What should I tell my health care provider before I take this medicine? They need to know if you have any of these conditions: -bowel disease, like colitis -kidney disease -liver disease -mononucleosis -phenylketonuria -an unusual or allergic reaction to amoxicillin, penicillin, cephalosporin, other antibiotics, clavulanic acid, other medicines, foods, dyes, or preservatives -pregnant or trying to get pregnant -breast-feeding How should I use this medicine? Take this medicine by mouth. Chew it completely before swallowing. Follow the directions on the prescription label. Take this medicine at the start of a meal or snack. Take your medicine at regular intervals. Do not take your medicine more often than directed. Take all of your medicine as directed even if you think you are better. Do not skip doses or stop your medicine early. Talk to your pediatrician regarding the use of this medicine in children. While this drug may be prescribed for selected conditions, precautions do apply. Overdosage: If you think you have taken too much of this medicine contact a poison control center or emergency room at once. NOTE: This medicine is only for you. Do not share this medicine with others. What if I miss a dose? If you miss a dose, take it as soon as you can. If it is almost time for your next dose, take only that dose. Do not take double or extra doses. What may interact with this medicine? -allopurinol -anticoagulants -birth control pills -methotrexate -probenecid This  list may not describe all possible interactions. Give your health care provider a list of all the medicines, herbs, non-prescription drugs, or dietary supplements you use. Also tell them if you smoke, drink alcohol, or use illegal drugs. Some items may interact with your medicine. What should I watch for while using this medicine? Tell your doctor or health care professional if your symptoms do not improve. Do not treat diarrhea with over the counter products. Contact your doctor if you have diarrhea that lasts more than 2 days or if it is severe and watery. If you have diabetes, you may get a false-positive result for sugar in your urine. Check with your doctor or health care professional. Birth control pills may not work properly while you are taking this medicine. Talk to your doctor about using an extra method of birth control. What side effects may I notice from receiving this medicine? Side effects that you should report to your doctor or health care professional as soon as possible: -allergic reactions like skin rash, itching or hives, swelling of the face, lips, or tongue -breathing problems -dark urine -fever or chills, sore throat -redness, blistering, peeling or loosening of the skin, including inside the mouth -seizures -trouble passing urine or change in the amount of urine -unusual bleeding, bruising -unusually weak or tired -white patches or sores in the mouth or throat Side effects that usually do not require medical attention (report to your doctor or health care professional if they continue or are bothersome): -diarrhea -dizziness -headache -nausea, vomiting -stomach upset -vaginal or anal irritation This list may not describe all possible side effects. Call your doctor for medical advice   about side effects. You may report side effects to FDA at 1-800-FDA-1088. Where should I keep my medicine? Keep out of the reach of children. Store at room temperature below 25 degrees  C (77 degrees F). Keep container tightly closed. Throw away any unused medicine after the expiration date. NOTE: This sheet is a summary. It may not cover all possible information. If you have questions about this medicine, talk to your doctor, pharmacist, or health care provider.  2018 Elsevier/Gold Standard (2008-01-13 11:38:22)  

## 2017-07-15 NOTE — Progress Notes (Signed)
Consult Note: Gyn-Onc  Ariel Stewart 51 y.o. female  CC:  Chief Complaint  Patient presents with  . Vulvar dysplasia    HPI: Ariel Stewart is a very pleasant 51 year old with a stage IB vulvar carcinoma. Ariel Stewart saw Dr. Ouida Sills initially in May 2011. 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 PET CT revealed no abnormal intake in the vulva. However, there was one external iliac node measuring 1.2 cm that did not have any uptake. Ariel Stewart was referred to [place] and subsequently August 2011 Ariel Stewart underwent repeat wide local excision and bilateral groin node dissection. There was a 2 cm left groin node that was slightly enlarged but soft and pathology was negative on the right of 0/5 lymph nodes and 0/2 on the left. The vulva revealed focal high-grade dysplasia extending to the margins but there was no residual invasive carcinoma.   Her Pap smear was negative in 2/16 with Dr. Ouida Sills. Her mammogram was also unremarkable. Unfortunately, when Dr. Ouida Sills saw her recently he noticed a lesion on the right posterior fourchette. This was biopsied and returned as VIN 3 with no evidence of invasive carcinoma identified. Ariel Stewart underwent wide local excision in the operating room on January 04, 2015. Pathology revealed high-grade dysplasia VIN 3 with negative margins. There is no invasive carcinoma.   Interval History: I last saw her for follow-up in August 2017. At that time her exam was unremarkable. Ariel Stewart had a negative Pap smear with Dr. Ouida Sills in March 2017. Today Ariel Stewart states that Ariel Stewart had a "abscess burst" on near her vagina with yellow discharge that is slightly bloody tinged. Ariel Stewart is complaining of pain of 4/10. Ariel Stewart did apply Neosporin to the area. Ariel Stewart states it developed because Ariel Stewart was so nervous as Ariel Stewart had some irritation after intercourse a  few weeks ago and Ariel Stewart and her husband applied some home vinegar to the vulva and they took a picture of what appeared to be a wide area. Ariel Stewart comes in today for follow-up. Ariel Stewart did see Dr. Ouida Sills in March of this year with a negative examination and negative Pap smear. Ariel Stewart's been diagnosed with COPD and finally quit smoking in April of this year. Her weight did go up to 247 pounds but is back down to 232 pounds. Ariel Stewart is exercising with a boot camp program. Ariel Stewart does have a lot of mood changes associated with her menopausal symptoms. Ariel Stewart saw her primary care physician a month ago who did blood work and confirmed that Ariel Stewart was menopausal. Ariel Stewart plans on calling Dr. Ouida Sills to discuss treatment for this.  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 Ariel Stewart has no other complaints.   Current Meds:  Outpatient Encounter Prescriptions as of 07/15/2017  Medication Sig  . ALPRAZolam (XANAX) 1 MG tablet Take 1 mg by mouth 2 (two) times daily as needed for anxiety.   Marland Kitchen amLODipine (NORVASC) 2.5 MG tablet Take one tablet (2.5 mg total) by mouth daily.  . citalopram (CELEXA) 40 MG tablet Take 40 mg by mouth every evening.   . Cyanocobalamin (VITAMIN B 12 PO) Take 1 tablet by mouth daily.  . mupirocin ointment (BACTROBAN) 2 % Apply 1 application topically daily as needed (yeast under belly).   Marland Kitchen omeprazole (PRILOSEC) 20 MG capsule Take 20 mg by mouth every evening.   . SUMAtriptan (IMITREX)  25 MG tablet Take 25 mg by mouth every 2 (two) hours as needed for migraine or headache. May repeat in 2 hours if headache persists or recurs.  . VENTOLIN HFA 108 (90 BASE) MCG/ACT inhaler Inhale 1 puff into the lungs as needed.   . Vitamin D, Ergocalciferol, (DRISDOL) 50000 UNITS CAPS capsule Take 50,000 Units by mouth 2 (two) times a week. Sunday and Monday  . [DISCONTINUED] HYDROcodone-acetaminophen (NORCO) 5-325 MG tablet Take 1 tablet by mouth every 6 (six) hours as needed for severe pain. (Patient not taking: Reported  on 07/02/2016)  . [DISCONTINUED] naproxen (NAPROSYN) 500 MG tablet Take 1 tablet (500 mg total) by mouth 2 (two) times daily as needed for mild pain, moderate pain or headache (TAKE WITH MEALS.). (Patient not taking: Reported on 01/02/2017)   No facility-administered encounter medications on file as of 07/15/2017.     Allergy:  Allergies  Allergen Reactions  . Sulfa Antibiotics Swelling    Genital swelling   . Varenicline Other (See Comments)    Depression/Suicidal Ideation  . Wellbutrin [Bupropion] Other (See Comments) and Anxiety     agaition  . Penicillins Rash    Has patient had a PCN reaction causing immediate rash, facial/tongue/throat swelling, SOB or lightheadedness with hypotension: Does not remember if immediate Has patient had a PCN reaction causing severe rash involving mucus membranes or skin necrosis: Yes Has patient had a PCN reaction that required hospitalization Yes Has patient had a PCN reaction occurring within the last 10 years: No If all of the above answers are "NO", then may proceed with Cephalosporin use.     Social Hx:   Social History   Social History  . Marital status: Married    Spouse name: N/A  . Number of children: N/A  . Years of education: N/A   Occupational History  . Not on file.   Social History Main Topics  . Smoking status: Current Every Day Smoker    Packs/day: 0.50    Years: 30.00    Types: Cigarettes  . Smokeless tobacco: Never Used  . Alcohol use Yes     Comment: rare  . Drug use: No  . Sexual activity: Yes    Birth control/ protection: Surgical   Other Topics Concern  . Not on file   Social History Narrative  . No narrative on file    Past Surgical Hx:  Past Surgical History:  Procedure Laterality Date  . BILATERAL SUPERFICIAL INGUINAL LYMPH NODE DISSECTION AND WIDE LOCAL EXCISION VULVA  06-11-2010  . CARDIAC CATHETERIZATION  01/30/2015   due to cardiac damage seen on stress test  . CESAREAN SECTION  may 2001 &  sept  2002   BILATERAL TUBAL LIGATION WITH LAST C/S  . CYSTO/  RETOGRADE PYELOGRAM/  LEFT URETERAL STONE EXTRACTION  07-19-2003  . ELBOW DEBRIDEMENT Left 07-27-2008  . EXTRACORPOREAL SHOCK WAVE LITHOTRIPSY  x2 in 2009 // x1 2010  . KNEE ARTHROSCOPY Bilateral left 12-07-2008 & 1988/  right 1989  . LAPAROSCOPIC GASTRIC BANDING  02-14-2008   AND  CLOSURE HIATAL HERNIA  . TONSILLECTOMY  1985  . VULVAR LESION REMOVAL N/A 01/04/2015   Procedure: WIDE LOCAL EXCISION OF RIGHT VULVA;  Surgeon: Imagene Gurney A. Alycia Rossetti, MD;  Location: [facility];  Service: Gynecology;  Laterality: N/A;  . WIDE EXCISION MELANOMA FROM BACK WITH PRIMARY CLOSURE AND BILATERAL AXILLARY SENTINAL LYMPH NODE DISSECTION  12-19-2008  . WIDE LOCAL EXCISION VULVA  03-19-2010    Past Medical Hx:  Past Medical History:  Diagnosis Date  . Arthritis   . Asthma   . Complication of anesthesia    PT STATES SEVERE ITCHING POST-OP-- REQUEST ANTIHISTAMINE   . GERD (gastroesophageal reflux disease)   . History of adjustable gastric banding   . 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 History of hiatal hernia   . History of hypertension    no issue or meds since gastric banding with wt loss  . History of kidney stones   . History of melanoma excision    BACK --  WIDE EXCISION REMOVAL IN 2010  . Mild obstructive sleep apnea    PER STUDY 10-16-2007-- NO CPAP RECOMMENDED  . Type 2 diabetes, diet controlled ([place])   . VIN III (vulvar intraepithelial neoplasia III)     Family Hx:  Family History  Problem Relation Age of Onset  . Diabetes Mother   . Heart failure Mother   . Diabetes Father     Vitals:  Blood pressure (!) 121/58, pulse 65, temperature 98.5 F (36.9 C), temperature source Oral, resp. rate 18, weight 232 lb (105.2 kg), SpO2 100 %.  Physical Exam: Well-nourished well-developed female in no acute distress.  Neck: Supple, no lymphadenopathy, no thyromegaly.  Lungs:  Clear to auscultation bilateral.  Cardiovascular: Regular rate and rhythm.  Abdomen: Morbidly obese. Soft, nontender nondistended no palpable masses or hepatomegaly.Ariel Stewart does have yeast 3 x1 cm patch under her pannus.  Groins: No lymphadenopathy.  Extremities: No edema  Pelvic: No evidence of recurrent vulvar cancer or dysplasia.  No visible lesions or hyperkeratosis. Ariel Stewart does have 1 prominent hemorrhoid. There is no active bleeding. Ariel Stewart does have a 2 x 1 cm area of fluctuance on the left buttocks with a small opening. Some purulent sanguinous discharge was removed. A culture was obtained. The vulva was examined and there are no visible lesions. Acetic acid was applied to the area of concern. It did not turn acetowhite. I discussed with her that we use 5% acetic acid in the clinic which Ariel Stewart uses was a much stronger induration. I discussed with her that the vaginal mucosa level anything will turn white if you ultimately leave the acetic acid long enough orbits very strong. Ariel Stewart is very reassured. Speculum examination reveals no lesions within the vagina. The cervix is without lesions.   Assessment/Plan:  51 year old with history of stage IB vulvar carcinoma who clinically has no evidence of recurrent cancer, but did have recurrent vulvar dysplasia. Her recent wide local excision revealed VIN-III with negative margins. Ariel Stewart return to see me in 12 months per her request. In the interim, Ariel Stewart will follow-up with Dr. Ouida Sills in March 2019.   Augmentin was sent to her pharmacy to treat her buttocks abscess. Ariel Stewart states that while Ariel Stewart is penicillin allergic Ariel Stewart is taking Augmentin without difficulty. 4. Ariel Stewart'll take 1 twice a day for 7 days.  Contact Dr. Ouida Sills to discuss her menopausal symptoms.  Eleen Litz A., MD 07/15/2017, 9:59 AM

## 2017-07-17 LAB — WOUND CULTURE

## 2017-07-20 ENCOUNTER — Telehealth: Payer: Self-pay | Admitting: Gynecologic Oncology

## 2017-07-20 NOTE — Telephone Encounter (Signed)
Left message for patient to follow up on current status.  She was prescribed abxs at her last visit for buttock abscess.  Informed patient of culture results.  Advised to call if her symptoms persist or worsen.

## 2018-07-02 ENCOUNTER — Emergency Department (HOSPITAL_COMMUNITY)
Admission: EM | Admit: 2018-07-02 | Discharge: 2018-07-02 | Disposition: A | Discharge status: Home / Self Care | Payer: 59 | Attending: Emergency Medicine | Admitting: Emergency Medicine

## 2018-07-02 ENCOUNTER — Emergency Department (HOSPITAL_COMMUNITY): Payer: 59

## 2018-07-02 ENCOUNTER — Encounter (HOSPITAL_COMMUNITY): Payer: Self-pay

## 2018-07-02 ENCOUNTER — Other Ambulatory Visit: Payer: Self-pay

## 2018-07-02 DIAGNOSIS — Z79899 Other long term (current) drug therapy: Secondary | ICD-10-CM | POA: Diagnosis not present

## 2018-07-02 DIAGNOSIS — J45909 Unspecified asthma, uncomplicated: Secondary | ICD-10-CM | POA: Diagnosis not present

## 2018-07-02 DIAGNOSIS — E119 Type 2 diabetes mellitus without complications: Secondary | ICD-10-CM | POA: Diagnosis not present

## 2018-07-02 DIAGNOSIS — R0789 Other chest pain: Secondary | ICD-10-CM | POA: Diagnosis not present

## 2018-07-02 DIAGNOSIS — F17210 Nicotine dependence, cigarettes, uncomplicated: Secondary | ICD-10-CM | POA: Diagnosis not present

## 2018-07-02 DIAGNOSIS — R079 Chest pain, unspecified: Secondary | ICD-10-CM | POA: Diagnosis present

## 2018-07-02 DIAGNOSIS — I10 Essential (primary) hypertension: Secondary | ICD-10-CM | POA: Insufficient documentation

## 2018-07-02 LAB — CBC WITH DIFFERENTIAL/PLATELET
Abs Immature Granulocytes: 0 10*3/uL (ref 0.0–0.1)
BASOS ABS: 0.1 10*3/uL (ref 0.0–0.1)
Basophils Relative: 1 %
EOS ABS: 0.1 10*3/uL (ref 0.0–0.7)
EOS PCT: 2 %
HEMATOCRIT: 37.6 % (ref 36.0–46.0)
Hemoglobin: 11.9 g/dL — ABNORMAL LOW (ref 12.0–15.0)
IMMATURE GRANULOCYTES: 0 %
Lymphocytes Relative: 35 %
Lymphs Abs: 1.9 10*3/uL (ref 0.7–4.0)
MCH: 28.3 pg (ref 26.0–34.0)
MCHC: 31.6 g/dL (ref 30.0–36.0)
MCV: 89.3 fL (ref 78.0–100.0)
Monocytes Absolute: 0.4 10*3/uL (ref 0.1–1.0)
Monocytes Relative: 8 %
NEUTROS PCT: 54 %
Neutro Abs: 2.9 10*3/uL (ref 1.7–7.7)
PLATELETS: 278 10*3/uL (ref 150–400)
RBC: 4.21 MIL/uL (ref 3.87–5.11)
RDW: 13.3 % (ref 11.5–15.5)
WBC: 5.4 10*3/uL (ref 4.0–10.5)

## 2018-07-02 LAB — BASIC METABOLIC PANEL
Anion gap: 8 (ref 5–15)
BUN: 8 mg/dL (ref 6–20)
CO2: 25 mmol/L (ref 22–32)
CREATININE: 0.83 mg/dL (ref 0.44–1.00)
Calcium: 8.7 mg/dL — ABNORMAL LOW (ref 8.9–10.3)
Chloride: 107 mmol/L (ref 98–111)
Glucose, Bld: 161 mg/dL — ABNORMAL HIGH (ref 70–99)
POTASSIUM: 3.8 mmol/L (ref 3.5–5.1)
SODIUM: 140 mmol/L (ref 135–145)

## 2018-07-02 LAB — I-STAT TROPONIN, ED
TROPONIN I, POC: 0 ng/mL (ref 0.00–0.08)
TROPONIN I, POC: 0 ng/mL (ref 0.00–0.08)

## 2018-07-02 NOTE — ED Triage Notes (Signed)
Patient states chest pain 8/10 while at work. Previous cardiac history. EMS gave 2 nitro, 324 ASA pain resolved 2/10.

## 2018-07-02 NOTE — ED Provider Notes (Signed)
[facility] Provider Note   CSN: [id number] Arrival date & time: 07/02/18  1236     History   Chief Complaint Chief Complaint  Patient presents with  . Chest Pain    HPI BERNARDETTE Stewart is a 52 y.o. female with a past medical history of GERD, hypertension and diabetes currently both diet controlled, asthma who presents to ED for evaluation of chest pain that began 2 hours ago.  She describes it as a squeezing sensation in the center of her heart.  It began when she was at work.  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  She was not exerting herself.  States that the pain was constant at the time and rated it at 8/10.  She contemplated whether to be evaluated in the ED.  She then called 911 eventually who gave her aspirin and nitroglycerin.  Pain has improved to 0/10 with this.  States that when she did have the pain, it was not aggravated or alleviated by any factors.  States that it was constant.  She reports history of cardiac cath done in 2016, stress test done sometime late 2018 to early 2019.  She also reports having Holter monitor placed and was told that she had PACs/PVCs with exercise.  She elected not to be on any medications.  She denies any history of MI, PE.  Denies any leg swelling, recent surgeries, recent prolonged travel, pleuritic chest pain, hormone use, abdominal pain, fever, cough.  She reports quitting tobacco use about 1.5 years ago.  Denies any other drug or alcohol use.  HPI  Past Medical History:  Diagnosis Date  . Arthritis   . Asthma   . Complication of anesthesia    PT STATES SEVERE ITCHING POST-OP-- REQUEST ANTIHISTAMINE   . GERD (gastroesophageal reflux disease)   . History of adjustable gastric banding   . 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 History of hiatal hernia   . History of hypertension    no issue or meds since gastric  banding with wt loss  . History of kidney stones   . History of melanoma excision    BACK --  WIDE EXCISION REMOVAL IN 2010  . Mild obstructive sleep apnea    PER STUDY 10-16-2007-- NO CPAP RECOMMENDED  . Type 2 diabetes, diet controlled ([place])   . VIN III (vulvar intraepithelial neoplasia III)     Patient Active Problem List   Diagnosis Date Noted  . Anxiety 07/02/2016  . Asthma 07/02/2016  . Depression 07/02/2016  . Hypertension 07/02/2016  . Lapband APS + [facility] repair April 2009 06/30/2012  . Obesity-preop weight 314 10/23/2011  . Melanoma of back-excision of bilateral SLNBx axillary Feb 2010 10/23/2011  . Vulvar cancer ([place]) 10/22/2011  . Diabetes mellitus type 2, uncomplicated ([place]) 84/16/6063    Past Surgical History:  Procedure Laterality Date  . BILATERAL SUPERFICIAL INGUINAL LYMPH NODE DISSECTION AND WIDE LOCAL EXCISION VULVA  06-11-2010  . CARDIAC CATHETERIZATION  01/30/2015   due to cardiac damage seen on stress test  . CESAREAN SECTION  may 2001 &  sept 2002   BILATERAL TUBAL LIGATION WITH LAST C/S  . CYSTO/  RETOGRADE PYELOGRAM/  LEFT URETERAL STONE EXTRACTION  07-19-2003  . ELBOW DEBRIDEMENT Left 07-27-2008  . EXTRACORPOREAL SHOCK WAVE LITHOTRIPSY  x2 in 2009 // x1 2010  . KNEE ARTHROSCOPY  Bilateral left 12-07-2008 & 1988/  right 1989  . LAPAROSCOPIC GASTRIC BANDING  02-14-2008   AND  CLOSURE HIATAL HERNIA  . TONSILLECTOMY  1985  . VULVAR LESION REMOVAL N/A 01/04/2015   Procedure: WIDE LOCAL EXCISION OF RIGHT VULVA;  Surgeon: Imagene Gurney A. Alycia Rossetti, MD;  Location: [facility];  Service: Gynecology;  Laterality: N/A;  . WIDE EXCISION MELANOMA FROM BACK WITH PRIMARY CLOSURE AND BILATERAL AXILLARY SENTINAL LYMPH NODE DISSECTION  12-19-2008  . WIDE LOCAL EXCISION VULVA  03-19-2010     OB History   None      Home Medications    Prior to Admission medications   Medication Sig Start Date End Date Taking? Authorizing Provider  ALPRAZolam Duanne Moron) 1 MG  tablet Take 1 mg by mouth 2 (two) times daily as needed for anxiety.  07/28/11   [provider]  amLODipine (NORVASC) 2.5 MG tablet Take one tablet (2.5 mg total) by mouth daily. 07/09/17   [provider]  citalopram (CELEXA) 40 MG tablet Take 40 mg by mouth every evening.     [provider]  Cyanocobalamin (VITAMIN B 12 PO) Take 1 tablet by mouth daily.    [provider]  mupirocin ointment (BACTROBAN) 2 % Apply 1 application topically daily as needed (yeast under belly).  05/21/15   [provider]  omeprazole (PRILOSEC) 20 MG capsule Take 20 mg by mouth every evening.  07/24/11   [provider]  SUMAtriptan (IMITREX) 25 MG tablet Take 25 mg by mouth every 2 (two) hours as needed for migraine or headache. May repeat in 2 hours if headache persists or recurs.    [provider]  VENTOLIN HFA 108 (90 BASE) MCG/ACT inhaler Inhale 1 puff into the lungs as needed.  07/14/11   [provider]  Vitamin D, Ergocalciferol, (DRISDOL) 50000 UNITS CAPS capsule Take 50,000 Units by mouth 2 (two) times a week. Sunday and Monday    [provider]    Family History Family History  Problem Relation Age of Onset  . Diabetes Mother   . Heart failure Mother   . Diabetes Father     Social History Social History   Tobacco Use  . Smoking status: Current Every Day Smoker    Packs/day: 0.50    Years: 30.00    Pack years: 15.00    Types: Cigarettes  . Smokeless tobacco: Never Used  Substance Use Topics  . Alcohol use: Yes    Comment: rare  . Drug use: No     Allergies   Sulfa antibiotics; Varenicline; Wellbutrin [bupropion]; and Penicillins   Review of Systems Review of Systems  Constitutional: Negative for appetite change, chills and fever.  HENT: Negative for ear pain, rhinorrhea, sneezing and sore throat.   Eyes: Negative for photophobia and visual disturbance.  Respiratory: Negative for cough, chest tightness,  shortness of breath and wheezing.   Cardiovascular: Positive for chest pain. Negative for palpitations.  Gastrointestinal: Negative for abdominal pain, blood in stool, constipation, diarrhea, nausea and vomiting.  Genitourinary: Negative for dysuria, hematuria and urgency.  Musculoskeletal: Negative for myalgias.  Skin: Negative for rash.  Neurological: Negative for dizziness, weakness and light-headedness.     Physical Exam Updated Vital Signs BP 113/71   Pulse 61   Temp 98.1 F (36.7 C) (Oral)   Resp (!) 22   Ht 5\' 5"  (1.651 m)   Wt 98.9 kg   SpO2 94%   BMI 36.28 kg/m   Physical Exam  Constitutional: She appears well-developed and well-nourished. No distress.  HENT:  Head: Normocephalic and atraumatic.  Nose: Nose normal.  Eyes: Conjunctivae and EOM are normal. Left eye exhibits no discharge. No scleral icterus.  Neck: Normal range of motion. Neck supple.  Cardiovascular: Normal rate, regular rhythm, normal heart sounds and intact distal pulses. Exam reveals no gallop and no friction rub.  No murmur heard. Pulmonary/Chest: Effort normal and breath sounds normal. No respiratory distress.  Abdominal: Soft. Bowel sounds are normal. She exhibits no distension. There is no tenderness. There is no guarding.  Musculoskeletal: Normal range of motion. She exhibits no edema.  No lower extremity edema, erythema or calf tenderness bilaterally.  Neurological: She is alert. She exhibits normal muscle tone. Coordination normal.  Skin: Skin is warm and dry. No rash noted.  Psychiatric: She has a normal mood and affect.  Nursing note and vitals reviewed.    ED Treatments / Results  Labs (all labs ordered are listed, but only abnormal results are displayed) Labs Reviewed  BASIC METABOLIC PANEL - Abnormal; Notable for the following components:      Result Value   Glucose, Bld 161 (*)    Calcium 8.7 (*)    All other components within normal limits  CBC WITH DIFFERENTIAL/PLATELET -  Abnormal; Notable for the following components:   Hemoglobin 11.9 (*)    All other components within normal limits  I-STAT TROPONIN, ED  I-STAT TROPONIN, ED    EKG EKG Interpretation  Date/Time:  Friday July 02 2018 12:51:22 EDT Ventricular Rate:  65 PR Interval:    QRS Duration: 101 QT Interval:  442 QTC Calculation: 460 R Axis:   39 Text Interpretation:  Sinus rhythm Low voltage, precordial leads when compared to prior, no significant changes seen.  No STEMI Confirmed by Antony Blackbird [phone]) on 07/02/2018 1:47:58 PM   Radiology Dg Chest 2 View  Result Date: 07/02/2018 CLINICAL DATA:  Squeezing chest pain. EXAM: CHEST - 2 VIEW COMPARISON:  11/30/2014. FINDINGS: Mediastinum hilar structures normal. Low lung volumes with mild bibasilar atelectasis. No pleural effusion or pneumothorax. Surgical clips noted over the right chest. IMPRESSION: No acute cardiopulmonary disease. Electronically Signed   By: Marcello Moores  Register   On: 07/02/2018 13:34    Procedures Procedures (including critical care time)  Medications Ordered in ED Medications - No data to display   Initial Impression / Assessment and Plan / ED Course  I have reviewed the triage vital signs and the nursing notes.  Pertinent labs & imaging results that were available during my care of the patient were reviewed by me and considered in my medical decision making (see chart for details).  Clinical Course as of Jul 03 1551  Fri Jul 02, 2018  1515 On recheck, patient continuing to rest comfortably and is pain-free.  Continue to monitor and obtain delta troponin.   [HK]    Clinical Course User Index [HK] Delia Heady, PA-C    52 year old female with a past medical history of GERD, hypertension and diabetes currently both diet-controlled, asthma who presents to ED for evaluation of chest pain that began 2 hours prior to arrival.  She describes it as a squeezing sensation in the center of her chest.  Began while she was  at work doing nonexertional work.  She states that the pain was constant at the time and rated at 8/10.  Pain improved to 0/10 with nitroglycerin and aspirin given by EMS.  She states that the pain was not aggravated or  alleviated with any specific factors.  Denies any pleuritic chest pain.  Denies any history of MI or PE.  She had a history of abnormal stress test done in 2016 which caused her to undergo a cardiac catheterization.  States that this was clean.  Denies any recent surgeries, recent prolonged travel, hormone use, leg swelling.  EKG shows sinus rhythm with no changes from prior tracings.  Chest x-ray is unremarkable.  CBC, BMP, troponin unremarkable.  She is low risk by Wells score.  She is low risk by heart score (3).  Delta troponin returned as negative. Patient requesting discharge home with cards f/u.  I feel that this is reasonable based on her reassuring work-up today.  Will advise her to return to ED for any severe worsening symptoms.  Portions of this note were generated with [employer]. Dictation errors may occur despite best attempts at proofreading.  Final Clinical Impressions(s) / ED Diagnoses   Final diagnoses:  Chest wall pain    ED Discharge Orders    None       Delia Heady, PA-C 07/02/18 1552    Tegeler, Gwenyth Allegra, MD 07/02/18 [phone]

## 2018-07-02 NOTE — Discharge Instructions (Signed)
Return to ED for worsening symptoms, severe chest pain or abdominal pain, trouble breathing or trouble swallowing, coughing up blood or leg swelling.

## 2018-12-02 ENCOUNTER — Telehealth: Payer: Self-pay

## 2018-12-02 NOTE — Telephone Encounter (Signed)
Incoming call from pt -reports she used to see Ariel Stewart and missed her Sept 2019 appt but has saw Ariel Stewart her primary GYN in between for f/u's.  Pt aware Ariel Stewart no longer here and is ok with scheduling appt with Ariel Stewart.  Appt for Feb 7th given at 9:45 am.  Pt reports there's been no issues.  No other needs per pt at this time.

## 2018-12-10 ENCOUNTER — Encounter: Payer: Self-pay | Admitting: Obstetrics

## 2018-12-10 ENCOUNTER — Inpatient Hospital Stay: Payer: 59 | Attending: Obstetrics | Admitting: Obstetrics

## 2018-12-10 VITALS — BP 131/84 | HR 68 | Temp 97.9°F | Resp 16 | Ht 65.0 in | Wt 243.0 lb

## 2018-12-10 DIAGNOSIS — N903 Dysplasia of vulva, unspecified: Secondary | ICD-10-CM

## 2018-12-10 DIAGNOSIS — C519 Malignant neoplasm of vulva, unspecified: Secondary | ICD-10-CM | POA: Diagnosis not present

## 2018-12-10 NOTE — Patient Instructions (Signed)
Please call in December of 2020 for follow-up appointment in February 2020.

## 2018-12-10 NOTE — Progress Notes (Signed)
Consult Note: Gyn-Onc  Ariel Stewart 53 y.o. female  CC:  Chief Complaint  Patient presents with  . 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 She saw Ariel Stewart initially in May 2011. 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 PET CT revealed no abnormal intake in the vulva. However, there was one external iliac node measuring 1.2 cm that did not have any uptake. She was referred to Ariel Stewart and subsequently August 2011 she underwent repeat wide local excision and bilateral groin node dissection. There was a 2 cm left groin node that was slightly enlarged but soft and pathology was negative on the right of 0/5 lymph nodes and 0/2 on the left. The vulva revealed focal high-grade dysplasia extending to the margins but there was no residual invasive carcinoma.   Her Pap smear was negative in 2/16 with Ariel Stewart. Her mammogram was also unremarkable. Unfortunately, when Ariel Stewart saw her recently he noticed a lesion on the right posterior fourchette. This was biopsied and returned as VIN 3 with no evidence of invasive carcinoma identified. She underwent wide local excision in the operating room on January 04, 2015. Pathology revealed high-grade dysplasia VIN 3 with negative margins. There is no invasive carcinoma.   Interval History: She was last seen by Ariel Stewart 07/2017. She was due to see Ariel Stewart earlier than today but had lost track of her instructions to call for followup in one year.  She has seen Ariel Stewart (her GYN) in the interim and had a pelvic/pap/vulvar exam normal per report.  Ariel Stewart noted that she had been diagnosed with COPD and finally quit smoking in April 2019 but today she tells Ariel Stewart she is vaping.  No concerns voiced today  Current Meds:  Outpatient  Encounter Medications as of 12/10/2018  Medication Sig  . ALPRAZolam (XANAX) 1 MG tablet Take 1 mg by mouth 2 (two) times daily as needed for anxiety.   . citalopram (CELEXA) 40 MG tablet Take 40 mg by mouth every evening.   . Cyanocobalamin (VITAMIN B 12 PO) Take 1 tablet by mouth daily.  . mupirocin ointment (BACTROBAN) 2 % Apply 1 application topically daily as needed (yeast under belly).   Marland Kitchen omeprazole (PRILOSEC) 20 MG capsule Take 20 mg by mouth every evening.   Marland Kitchen OVER THE COUNTER MEDICATION Allegra daily  . SUMAtriptan (IMITREX) 25 MG tablet Take 25 mg by mouth every 2 (two) hours as needed for migraine or headache. May repeat in 2 hours if headache persists or recurs.  . VENTOLIN HFA 108 (90 BASE) MCG/ACT inhaler Inhale 1 puff into the lungs as needed.   . Vitamin D, Ergocalciferol, (DRISDOL) 50000 UNITS CAPS capsule Take 50,000 Units by mouth 2 (two) times a week. Sunday and Monday  . [DISCONTINUED] amLODipine (NORVASC) 2.5 MG tablet Take one tablet (2.5 mg total) by mouth daily.   No facility-administered encounter medications on file as of 12/10/2018.     Allergy:  Allergies  Allergen Reactions  . Sulfa Antibiotics Swelling    Genital swelling   . Varenicline Other (See Comments)    Depression/Suicidal Ideation  . Wellbutrin [Bupropion] Other (See Comments) and Anxiety     agaition  . Penicillins Rash    Has patient had a PCN reaction causing immediate  rash, facial/tongue/throat swelling, SOB or lightheadedness with hypotension: Does not remember if immediate Has patient had a PCN reaction causing severe rash involving mucus membranes or skin necrosis: Yes Has patient had a PCN reaction that required hospitalization Yes Has patient had a PCN reaction occurring within the last 10 years: No If all of the above answers are "NO", then may proceed with Cephalosporin use.     Social Hx:   Social History   Socioeconomic History  . Marital status: Married    Spouse name: Not on  file  . Number of children: Not on file  . Years of education: Not on file  . Highest education level: Not on file  Occupational History  . Not on file  Social Needs  . Financial resource strain: Not on file  . Food insecurity:    Worry: Not on file    Inability: Not on file  . Transportation needs:    Medical: Not on file    Non-medical: Not on file  Tobacco Use  . Smoking status: Former Smoker    Packs/day: 0.50    Years: 30.00    Pack years: 15.00    Types: Cigarettes    Last attempt to quit: 02/22/2017    Years since quitting: 1.7  . Smokeless tobacco: Never Used  . Tobacco comment: Quit smoking February 22, 2017  Substance and Sexual Activity  . Alcohol use: Yes    Comment: rare  . Drug use: No  . Sexual activity: Yes    Birth control/protection: Surgical  Lifestyle  . Physical activity:    Days per week: Not on file    Minutes per session: Not on file  . Stress: Not on file  Relationships  . Social connections:    Talks on phone: Not on file    Gets together: Not on file    Attends religious service: Not on file    Active member of club or organization: Not on file    Attends meetings of clubs or organizations: Not on file    Relationship status: Not on file  . Intimate partner violence:    Fear of current or ex partner: Not on file    Emotionally abused: Not on file    Physically abused: Not on file    Forced sexual activity: Not on file  Other Topics Concern  . Not on file  Social History Narrative  . Not on file    Past Surgical Hx:  Past Surgical History:  Procedure Laterality Date  . BILATERAL SUPERFICIAL INGUINAL LYMPH NODE DISSECTION AND WIDE LOCAL EXCISION VULVA  06-11-2010  . CARDIAC CATHETERIZATION  01/30/2015   due to cardiac damage seen on stress test  . CESAREAN SECTION  may 2001 &  sept 2002   BILATERAL TUBAL LIGATION WITH LAST C/S  . CYSTO/  RETOGRADE PYELOGRAM/  LEFT URETERAL STONE EXTRACTION  07-19-2003  . ELBOW DEBRIDEMENT Left  07-27-2008  . EXTRACORPOREAL SHOCK WAVE LITHOTRIPSY  x2 in 2009 // x1 2010  . KNEE ARTHROSCOPY Bilateral left 12-07-2008 & 1988/  right 1989  . LAPAROSCOPIC GASTRIC BANDING  02-14-2008   AND  CLOSURE HIATAL HERNIA  . TONSILLECTOMY  1985  . VULVAR LESION REMOVAL N/A 01/04/2015   Procedure: WIDE LOCAL EXCISION OF RIGHT VULVA;  Surgeon: Imagene Gurney A. Alycia Rossetti, MD;  Location: [facility];  Service: Gynecology;  Laterality: N/A;  . WIDE EXCISION MELANOMA FROM BACK WITH PRIMARY CLOSURE AND BILATERAL AXILLARY SENTINAL LYMPH NODE DISSECTION  12-19-2008  .  WIDE LOCAL EXCISION VULVA  03-19-2010    Past Medical Hx:  Past Medical History:  Diagnosis Date  . Arthritis   . Asthma   . Complication of anesthesia    PT STATES SEVERE ITCHING POST-OP-- REQUEST ANTIHISTAMINE   . GERD (gastroesophageal reflux disease)   . History of adjustable gastric banding   . 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 History of hiatal hernia   . History of hypertension    no issue or meds since gastric banding with wt loss  . History of kidney stones   . History of melanoma excision    BACK --  WIDE EXCISION REMOVAL IN 2010  . Mild obstructive sleep apnea    PER STUDY 10-16-2007-- NO CPAP RECOMMENDED  . Type 2 diabetes, diet controlled ([place])   . VIN III (vulvar intraepithelial neoplasia III)     Family Hx:  Family History  Problem Relation Age of Onset  . Diabetes Mother   . Heart failure Mother   . Diabetes Father     Review of Systems  Endocrine: Positive for hot flashes.  All other systems reviewed and are negative.    Vitals:  Blood pressure 131/84, pulse 68, temperature 97.9 F (36.6 C), temperature source Oral, resp. rate 16, height 5\' 5"  (1.651 m), weight 243 lb (110.2 kg), SpO2 96 %.  Physical Exam: General :  Well developed, 53 y.o., female in no apparent distress HEENT:  Normocephalic/atraumatic, symmetric, EOMI, eyelids normal Neck:    Supple, no masses.  Lymphatics:  No cervical/ submandibular/ supraclavicular/ infraclavicular/ inguinal adenopathy Respiratory:  Respirations unlabored, no use of accessory muscles CV:   Deferred Breast:  Deferred Musculoskeletal: No CVA tenderness, normal muscle strength. Abdomen:  Soft, non-tender and nondistended. No evidence of hernia. No masses. Extremities:  No lymphedema, no erythema, non-tender. Skin:   Normal inspection Neuro/Psych:  No focal motor deficit, no abnormal mental status. Normal gait. Normal affect. Alert and oriented to person, place, and time  Pelvic: Bimanual, smooth vaginal walls/cervix. No masses. No evidence of recurrent vulvar cancer or dysplasia.  No visible lesions or hyperkeratosis. Application of acetic acid revealed no areas of concern. Rectal deferred   Assessment/Plan: History of stage IB vulvar carcinoma who clinically has no evidence of recurrent cancer, but did have recurrent vulvar dysplasia.  We will have her return to this office in 12 months and then see Ariel Stewart in the interim.  Face to face time with patient was 15 minutes. Over 50% of this time was spent on counseling and coordination of care.   Isabel Caprice, MD 12/10/2018, 10:38 AM   Cc: Freda Munro, MD (Ref Ob/GYN)

## 2018-12-16 ENCOUNTER — Telehealth: Payer: Self-pay | Admitting: *Deleted

## 2018-12-16 NOTE — Telephone Encounter (Signed)
Retrieved the patient's voicemail, patient stated that she needed a call back. Spoke with the patient and she stated "I saw Dr. Gerarda Fraction last week and she used acetic acid during my exam. Even since then I have had burning, itching, and swelling." Melissa APP given the message. Per Melissa APP "Patient has had a reaction to it. She can take benadryl pills and apply hydrocortisone cream to the area. Call back if that doesn't help." Gave the patient the information above. Patient verbalized understanding.

## 2019-06-03 ENCOUNTER — Other Ambulatory Visit: Payer: Self-pay | Admitting: Obstetrics and Gynecology

## 2019-06-10 ENCOUNTER — Other Ambulatory Visit: Payer: Self-pay

## 2019-06-10 ENCOUNTER — Encounter (HOSPITAL_BASED_OUTPATIENT_CLINIC_OR_DEPARTMENT_OTHER): Payer: Self-pay | Admitting: *Deleted

## 2019-06-10 NOTE — Progress Notes (Signed)
Spoke w/ pt via phone for pre-op interview.  Npo after mn.  Arrive at 1015.  Needs istat.  Current ekg in chart and epic.  Pt getting covid test done 06-11-2019.  Will do breo inhaler am dos w/ sips of water and asked to bring rescue inhaler.  Also, asked pt to bring bipap mask and tubing dos.

## 2019-06-11 ENCOUNTER — Other Ambulatory Visit (HOSPITAL_COMMUNITY)
Admission: RE | Admit: 2019-06-11 | Discharge: 2019-06-11 | Disposition: A | Discharge status: Home / Self Care | Payer: 59 | Source: Ambulatory Visit | Attending: Obstetrics and Gynecology | Admitting: Obstetrics and Gynecology

## 2019-06-11 DIAGNOSIS — Z8619 Personal history of other infectious and parasitic diseases: Secondary | ICD-10-CM | POA: Insufficient documentation

## 2019-06-11 DIAGNOSIS — Z20828 Contact with and (suspected) exposure to other viral communicable diseases: Secondary | ICD-10-CM | POA: Diagnosis not present

## 2019-06-11 DIAGNOSIS — Z01812 Encounter for preprocedural laboratory examination: Secondary | ICD-10-CM | POA: Insufficient documentation

## 2019-06-11 LAB — SARS CORONAVIRUS 2 (TAT 6-24 HRS): SARS Coronavirus 2: NEGATIVE

## 2019-06-15 ENCOUNTER — Ambulatory Visit (HOSPITAL_BASED_OUTPATIENT_CLINIC_OR_DEPARTMENT_OTHER)
Admission: RE | Admit: 2019-06-15 | Discharge: 2019-06-15 | Disposition: A | Discharge status: Home / Self Care | Payer: 59 | Attending: Obstetrics and Gynecology | Admitting: Obstetrics and Gynecology

## 2019-06-15 ENCOUNTER — Other Ambulatory Visit: Payer: Self-pay

## 2019-06-15 ENCOUNTER — Encounter (HOSPITAL_BASED_OUTPATIENT_CLINIC_OR_DEPARTMENT_OTHER)
Admission: RE | Disposition: A | Discharge status: Home / Self Care | Payer: Self-pay | Source: Home / Self Care | Attending: Obstetrics and Gynecology

## 2019-06-15 ENCOUNTER — Ambulatory Visit (HOSPITAL_BASED_OUTPATIENT_CLINIC_OR_DEPARTMENT_OTHER): Payer: 59 | Admitting: Anesthesiology

## 2019-06-15 ENCOUNTER — Encounter (HOSPITAL_BASED_OUTPATIENT_CLINIC_OR_DEPARTMENT_OTHER): Payer: Self-pay | Admitting: Anesthesiology

## 2019-06-15 DIAGNOSIS — Z9884 Bariatric surgery status: Secondary | ICD-10-CM | POA: Diagnosis not present

## 2019-06-15 DIAGNOSIS — Z7951 Long term (current) use of inhaled steroids: Secondary | ICD-10-CM | POA: Insufficient documentation

## 2019-06-15 DIAGNOSIS — M1990 Unspecified osteoarthritis, unspecified site: Secondary | ICD-10-CM | POA: Insufficient documentation

## 2019-06-15 DIAGNOSIS — K219 Gastro-esophageal reflux disease without esophagitis: Secondary | ICD-10-CM | POA: Diagnosis not present

## 2019-06-15 DIAGNOSIS — Z30432 Encounter for removal of intrauterine contraceptive device: Secondary | ICD-10-CM | POA: Diagnosis present

## 2019-06-15 DIAGNOSIS — Z7984 Long term (current) use of oral hypoglycemic drugs: Secondary | ICD-10-CM | POA: Diagnosis not present

## 2019-06-15 DIAGNOSIS — Z87891 Personal history of nicotine dependence: Secondary | ICD-10-CM | POA: Diagnosis not present

## 2019-06-15 DIAGNOSIS — E119 Type 2 diabetes mellitus without complications: Secondary | ICD-10-CM | POA: Insufficient documentation

## 2019-06-15 DIAGNOSIS — Z6841 Body Mass Index (BMI) 40.0 and over, adult: Secondary | ICD-10-CM | POA: Diagnosis not present

## 2019-06-15 DIAGNOSIS — E669 Obesity, unspecified: Secondary | ICD-10-CM | POA: Insufficient documentation

## 2019-06-15 DIAGNOSIS — J449 Chronic obstructive pulmonary disease, unspecified: Secondary | ICD-10-CM | POA: Insufficient documentation

## 2019-06-15 DIAGNOSIS — G4733 Obstructive sleep apnea (adult) (pediatric): Secondary | ICD-10-CM | POA: Insufficient documentation

## 2019-06-15 DIAGNOSIS — Z79899 Other long term (current) drug therapy: Secondary | ICD-10-CM | POA: Diagnosis not present

## 2019-06-15 HISTORY — DX: Nocturia: R35.1

## 2019-06-15 HISTORY — DX: Obstructive sleep apnea (adult) (pediatric): G47.33

## 2019-06-15 HISTORY — DX: Presence of spectacles and contact lenses: Z97.3

## 2019-06-15 HISTORY — PX: HYSTEROSCOPY: SHX211

## 2019-06-15 HISTORY — DX: Chronic obstructive pulmonary disease, unspecified: J44.9

## 2019-06-15 HISTORY — DX: Other intervertebral disc displacement, lumbar region: M51.26

## 2019-06-15 HISTORY — PX: IUD REMOVAL: SHX5392

## 2019-06-15 HISTORY — DX: Personal history of diseases of the blood and blood-forming organs and certain disorders involving the immune mechanism: Z86.2

## 2019-06-15 HISTORY — DX: Other intervertebral disc degeneration, lumbar region: M51.36

## 2019-06-15 HISTORY — DX: Diaphragmatic hernia without obstruction or gangrene: K44.9

## 2019-06-15 HISTORY — DX: Personal history of other specified conditions: Z87.898

## 2019-06-15 LAB — POCT I-STAT 4, (NA,K, GLUC, HGB,HCT)
Glucose, Bld: 128 mg/dL — ABNORMAL HIGH (ref 70–99)
HCT: 37 % (ref 36.0–46.0)
Hemoglobin: 12.6 g/dL (ref 12.0–15.0)
Potassium: 3.9 mmol/L (ref 3.5–5.1)
Sodium: 140 mmol/L (ref 135–145)

## 2019-06-15 LAB — GLUCOSE, CAPILLARY: Glucose-Capillary: 102 mg/dL — ABNORMAL HIGH (ref 70–99)

## 2019-06-15 SURGERY — HYSTEROSCOPY
Anesthesia: General | Site: Perineum

## 2019-06-15 MED ORDER — ONDANSETRON HCL 4 MG/2ML IJ SOLN
INTRAMUSCULAR | Status: AC
  Filled 2019-06-15: qty 2

## 2019-06-15 MED ORDER — MIDAZOLAM HCL 2 MG/2ML IJ SOLN
INTRAMUSCULAR | Status: AC
  Filled 2019-06-15: qty 2

## 2019-06-15 MED ORDER — LACTATED RINGERS IV SOLN
INTRAVENOUS | Status: DC
  Administered 2019-06-15: 1000 mL via INTRAVENOUS
  Administered 2019-06-15: 50 mL/h via INTRAVENOUS
  Filled 2019-06-15: qty 1000

## 2019-06-15 MED ORDER — CEFAZOLIN SODIUM-DEXTROSE 2-3 GM-%(50ML) IV SOLR
INTRAVENOUS | Status: DC | PRN
  Administered 2019-06-15: 2 g via INTRAVENOUS

## 2019-06-15 MED ORDER — DEXAMETHASONE SODIUM PHOSPHATE 10 MG/ML IJ SOLN
INTRAMUSCULAR | Status: DC | PRN
  Administered 2019-06-15: 5 mg via INTRAVENOUS

## 2019-06-15 MED ORDER — KETOROLAC TROMETHAMINE 30 MG/ML IJ SOLN
INTRAMUSCULAR | Status: AC
  Filled 2019-06-15: qty 1

## 2019-06-15 MED ORDER — ONDANSETRON HCL 4 MG/2ML IJ SOLN
INTRAMUSCULAR | Status: DC | PRN
  Administered 2019-06-15: 4 mg via INTRAVENOUS

## 2019-06-15 MED ORDER — DIPHENHYDRAMINE HCL 50 MG/ML IJ SOLN
INTRAMUSCULAR | Status: DC | PRN
  Administered 2019-06-15: 25 mg via INTRAVENOUS

## 2019-06-15 MED ORDER — LIDOCAINE 2% (20 MG/ML) 5 ML SYRINGE
INTRAMUSCULAR | Status: AC
  Filled 2019-06-15: qty 5

## 2019-06-15 MED ORDER — PROPOFOL 10 MG/ML IV BOLUS
INTRAVENOUS | Status: AC
  Filled 2019-06-15: qty 40

## 2019-06-15 MED ORDER — LIDOCAINE HCL (PF) 1 % IJ SOLN
INTRAMUSCULAR | Status: DC | PRN
  Administered 2019-06-15: 15 mL

## 2019-06-15 MED ORDER — OXYCODONE HCL 5 MG PO TABS
5.0000 mg | ORAL_TABLET | Freq: Once | ORAL | Status: DC | PRN
  Filled 2019-06-15: qty 1

## 2019-06-15 MED ORDER — ONDANSETRON HCL 4 MG/2ML IJ SOLN
4.0000 mg | Freq: Once | INTRAMUSCULAR | Status: DC | PRN
  Filled 2019-06-15: qty 2

## 2019-06-15 MED ORDER — FENTANYL CITRATE (PF) 100 MCG/2ML IJ SOLN
INTRAMUSCULAR | Status: DC | PRN
  Administered 2019-06-15: 100 ug via INTRAVENOUS

## 2019-06-15 MED ORDER — MEPERIDINE HCL 25 MG/ML IJ SOLN
6.2500 mg | INTRAMUSCULAR | Status: DC | PRN
  Filled 2019-06-15: qty 1

## 2019-06-15 MED ORDER — DEXAMETHASONE SODIUM PHOSPHATE 10 MG/ML IJ SOLN
INTRAMUSCULAR | Status: AC
  Filled 2019-06-15: qty 1

## 2019-06-15 MED ORDER — ACETAMINOPHEN 160 MG/5ML PO SOLN
325.0000 mg | ORAL | Status: DC | PRN
  Filled 2019-06-15: qty 20.3

## 2019-06-15 MED ORDER — ARTIFICIAL TEARS OPHTHALMIC OINT
TOPICAL_OINTMENT | OPHTHALMIC | Status: AC
  Filled 2019-06-15: qty 3.5

## 2019-06-15 MED ORDER — LIDOCAINE 2% (20 MG/ML) 5 ML SYRINGE
INTRAMUSCULAR | Status: DC | PRN
  Administered 2019-06-15: 60 mg via INTRAVENOUS

## 2019-06-15 MED ORDER — FENTANYL CITRATE (PF) 100 MCG/2ML IJ SOLN
25.0000 ug | INTRAMUSCULAR | Status: DC | PRN
  Filled 2019-06-15: qty 1

## 2019-06-15 MED ORDER — KETOROLAC TROMETHAMINE 30 MG/ML IJ SOLN
30.0000 mg | Freq: Once | INTRAMUSCULAR | Status: AC | PRN
  Administered 2019-06-15: 30 mg via INTRAVENOUS
  Filled 2019-06-15: qty 1

## 2019-06-15 MED ORDER — MIDAZOLAM HCL 2 MG/2ML IJ SOLN
INTRAMUSCULAR | Status: DC | PRN
  Administered 2019-06-15: 2 mg via INTRAVENOUS

## 2019-06-15 MED ORDER — ACETAMINOPHEN 325 MG PO TABS
325.0000 mg | ORAL_TABLET | ORAL | Status: DC | PRN
  Filled 2019-06-15: qty 2

## 2019-06-15 MED ORDER — CEFAZOLIN SODIUM 1 G IJ SOLR
INTRAMUSCULAR | Status: AC
  Filled 2019-06-15: qty 20

## 2019-06-15 MED ORDER — PROPOFOL 10 MG/ML IV BOLUS
INTRAVENOUS | Status: DC | PRN
  Administered 2019-06-15: 30 mg via INTRAVENOUS
  Administered 2019-06-15: 170 mg via INTRAVENOUS

## 2019-06-15 MED ORDER — SODIUM CHLORIDE 0.9 % IR SOLN
Status: DC | PRN
  Administered 2019-06-15: 3000 mL

## 2019-06-15 MED ORDER — FENTANYL CITRATE (PF) 100 MCG/2ML IJ SOLN
INTRAMUSCULAR | Status: AC
  Filled 2019-06-15: qty 2

## 2019-06-15 MED ORDER — OXYCODONE HCL 5 MG/5ML PO SOLN
5.0000 mg | Freq: Once | ORAL | Status: DC | PRN
  Filled 2019-06-15: qty 5

## 2019-06-15 SURGICAL SUPPLY — 19 items
BIPOLAR CUTTING LOOP 21FR (ELECTRODE) ×1
CANISTER SUCT 3000ML PPV (MISCELLANEOUS) ×2 IMPLANT
CATH ROBINSON RED A/P 16FR (CATHETERS) IMPLANT
COVER WAND RF STERILE (DRAPES) ×4 IMPLANT
DILATOR CANAL MILEX (MISCELLANEOUS) IMPLANT
DRSG TELFA 3X8 NADH (GAUZE/BANDAGES/DRESSINGS) IMPLANT
GLOVE SURG SS PI 7.0 STRL IVOR (GLOVE) ×2 IMPLANT
GOWN STRL REUS W/ TWL LRG LVL3 (GOWN DISPOSABLE) ×1 IMPLANT
GOWN STRL REUS W/ TWL XL LVL3 (GOWN DISPOSABLE) ×1 IMPLANT
GOWN STRL REUS W/TWL LRG LVL3 (GOWN DISPOSABLE) ×2
GOWN STRL REUS W/TWL XL LVL3 (GOWN DISPOSABLE) ×2
KIT PROCEDURE FLUENT (KITS) ×2 IMPLANT
KIT TURNOVER CYSTO (KITS) ×2 IMPLANT
LOOP CUTTING BIPOLAR 21FR (ELECTRODE) ×1 IMPLANT
PACK VAGINAL MINOR WOMEN LF (CUSTOM PROCEDURE TRAY) ×2 IMPLANT
PAD DRESSING TELFA 3X8 NADH (GAUZE/BANDAGES/DRESSINGS) IMPLANT
PAD OB MATERNITY 4.3X12.25 (PERSONAL CARE ITEMS) ×2 IMPLANT
TOWEL OR 17X26 10 PK STRL BLUE (TOWEL DISPOSABLE) ×2 IMPLANT
WATER STERILE IRR 500ML POUR (IV SOLUTION) ×2 IMPLANT

## 2019-06-15 NOTE — Anesthesia Postprocedure Evaluation (Signed)
Anesthesia Post Note  Patient: Ariel Stewart  Procedure(s) Performed: HYSTEROSCOPY (N/A Perineum) INTRAUTERINE DEVICE (IUD) REMOVAL (N/A Perineum)     Patient location during evaluation: PACU Anesthesia Type: General Level of consciousness: awake Pain management: pain level controlled Vital Signs Assessment: post-procedure vital signs reviewed and stable Respiratory status: spontaneous breathing Cardiovascular status: stable Postop Assessment: no apparent nausea or vomiting Anesthetic complications: no    Last Vitals:  Vitals:   06/15/19 1245 06/15/19 1300  BP: 121/80 111/63  Pulse: 73 73  Resp: (!) 21 18  Temp:    SpO2: 98% 91%    Last Pain:  Vitals:   06/15/19 1245  TempSrc:   PainSc: 0-No pain   Pain Goal: Patients Stated Pain Goal: 6 (06/15/19 1245)                 Huston Foley

## 2019-06-15 NOTE — Anesthesia Procedure Notes (Signed)
Procedure Name: LMA Insertion Date/Time: 06/15/2019 11:49 AM Performed by: Wanita Chamberlain, CRNA Pre-anesthesia Checklist: Patient identified, Emergency Drugs available, Suction available, Patient being monitored and Timeout performed Patient Re-evaluated:Patient Re-evaluated prior to induction Oxygen Delivery Method: Circle system utilized Preoxygenation: Pre-oxygenation with 100% oxygen Induction Type: IV induction Ventilation: Mask ventilation without difficulty LMA: LMA inserted LMA Size: 4.0 Number of attempts: 1 Placement Confirmation: breath sounds checked- equal and bilateral and CO2 detector Tube secured with: Tape Dental Injury: Teeth and Oropharynx as per pre-operative assessment

## 2019-06-15 NOTE — Discharge Instructions (Signed)
° ° ° ° °  Post Anesthesia Home Care Instructions ° °Activity: °Get plenty of rest for the remainder of the day. A responsible individual must stay with you for 24 hours following the procedure.  °For the next 24 hours, DO NOT: °-Drive a car °-Operate machinery °-Drink alcoholic beverages °-Take any medication unless instructed by your physician °-Make any legal decisions or sign important papers. ° °Meals: °Start with liquid foods such as gelatin or soup. Progress to regular foods as tolerated. Avoid greasy, spicy, heavy foods. If nausea and/or vomiting occur, drink only clear liquids until the nausea and/or vomiting subsides. Call your physician if vomiting continues. ° °Special Instructions/Symptoms: °Your throat may feel dry or sore from the anesthesia or the breathing tube placed in your throat during surgery. If this causes discomfort, gargle with warm salt water. The discomfort should disappear within 24 hours. ° °Do not take any nonsteroidal anti inflammatories until after 6:00 pm today. ° ° °   ° °

## 2019-06-15 NOTE — H&P (Signed)
Ariel Stewart is an 53 y/o white female who presents to the OR for a hysteroscopic removal of an IUD. Multilple attempts in the office were unsuccessful due to visualization. She is currently not having any complications with the IUD.    Past Medical History:  Diagnosis Date  . Arthritis   . Asthma with COPD ([place])    [place], note in epic , Royden Purl FNP  . Bulging lumbar disc    L4-5  . Complication of anesthesia    PT STATES SEVERE ITCHING POST-OP-- REQUEST ANTIHISTAMINE   . GERD (gastroesophageal reflux disease)   . Hiatal hernia   . History of adjustable gastric banding   . 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 History of chest pain    had cardiac cath for abnormal stress test 01-27-2015  normal coronaries and lvf  (06-10-2019  pt denies any cardiac s&s)  . History of hypertension    no issue or meds since gastric banding with wt loss  . History of iron deficiency anemia    per pt hx IV Iron infusion  . History of kidney stones   . History of melanoma excision    BACK --  WIDE EXCISION REMOVAL IN 2010  . History of palpitations    02/ 2019 [facility] cardiology evaluation,  event monitor 03-/ 2019 showed SR and ST with occasional PVCs and PACs  (06-10-2019  pt denied any palpitations)  . Nocturia   . OSA treated with BiPAP   . Type 2 diabetes mellitus ([place])    followed by pcp  . Wears glasses     Past Surgical History:  Procedure Laterality Date  . BILATERAL SUPERFICIAL INGUINAL LYMPH NODE DISSECTION AND WIDE LOCAL EXCISION VULVA  06-11-2010  . CARDIAC CATHETERIZATION  01/27/2015   @[facility]  [place]   normal coronary arteries and LVF   . CESAREAN SECTION  may 2001 &  sept 2002   BILATERAL TUBAL LIGATION WITH LAST C/S  . CYSTO/  RETOGRADE PYELOGRAM/  LEFT URETERAL STONE EXTRACTION  07-19-2003  . ELBOW DEBRIDEMENT Left 07-27-2008  . EXTRACORPOREAL SHOCK WAVE LITHOTRIPSY   x2 in 2009 // x1 2010  . KNEE ARTHROSCOPY Bilateral left 12-07-2008 & 1988/  right 1989  . LAPAROSCOPIC GASTRIC BANDING  02-14-2008   AND  CLOSURE HIATAL HERNIA  . TONSILLECTOMY  1985  . VULVAR LESION REMOVAL N/A 01/04/2015   Procedure: WIDE LOCAL EXCISION OF RIGHT VULVA;  Surgeon: Imagene Gurney A. Alycia Rossetti, MD;  Location[facility];  Service: Gynecology;  Laterality: N/A;  . WIDE EXCISION MELANOMA FROM BACK WITH PRIMARY CLOSURE AND BILATERAL AXILLARY SENTINAL LYMPH NODE DISSECTION  12-19-2008  . WIDE LOCAL EXCISION VULVA  03-19-2010    Family History  Problem Relation Age of Onset  . Diabetes Mother   . Heart failure Mother   . Diabetes Father    Social History:  reports that she quit smoking about 2 years ago. Her smoking use included cigarettes. She has a 15.00 pack-year smoking history. She has never used smokeless tobacco. She reports current alcohol use. She reports that she does not use drugs.  Allergies:  Allergies  Allergen Reactions  . Sulfa Antibiotics Swelling    Genital swelling   . Varenicline Other (See Comments)    Depression/Suicidal Ideation  . Wellbutrin [Bupropion] Other (See Comments) and Anxiety     agaition  .  Penicillins Rash    Has patient had a PCN reaction causing immediate rash, facial/tongue/throat swelling, SOB or lightheadedness with hypotension: Does not remember if immediate Has patient had a PCN reaction causing severe rash involving mucus membranes or skin necrosis: Yes Has patient had a PCN reaction that required hospitalization Yes Has patient had a PCN reaction occurring within the last 10 years: No If all of the above answers are "NO", then may proceed with Cephalosporin use.     Medications Prior to Admission  Medication Sig Dispense Refill  . cetirizine (ZYRTEC) 10 MG tablet Take 10 mg by mouth at bedtime.    . citalopram (CELEXA) 40 MG tablet Take 40 mg by mouth every evening.     . Cyanocobalamin (VITAMIN B 12 PO) Take 1 tablet  by mouth daily.    Marland Kitchen DICLOFENAC PO Take 1 tablet by mouth at bedtime.    . fluticasone furoate-vilanterol (BREO ELLIPTA) 100-25 MCG/INH AEPB Inhale 1 puff into the lungs daily.    Marland Kitchen GABAPENTIN PO Take 1 tablet by mouth at bedtime.    . metFORMIN (GLUCOPHAGE) 1000 MG tablet Take 1,000 mg by mouth daily with breakfast.    . mupirocin ointment (BACTROBAN) 2 % Apply 1 application topically daily as needed (yeast under belly).     Marland Kitchen omeprazole (PRILOSEC) 20 MG capsule Take 20 mg by mouth every evening.     . VENTOLIN HFA 108 (90 BASE) MCG/ACT inhaler Inhale 1 puff into the lungs as needed.     . Vitamin D, Ergocalciferol, (DRISDOL) 50000 UNITS CAPS capsule Take 50,000 Units by mouth 2 (two) times a week. Sunday and Monday    . ALPRAZolam (XANAX) 1 MG tablet Take 1 mg by mouth 2 (two) times daily as needed for anxiety.     . SUMAtriptan (IMITREX) 25 MG tablet Take 25 mg by mouth every 2 (two) hours as needed for migraine or headache. May repeat in 2 hours if headache persists or recurs.         Blood pressure (!) 146/82, pulse 81, temperature 98 F (36.7 C), temperature source Oral, resp. rate 18, height 5\' 5"  (1.651 m), weight 109.7 kg, SpO2 98 %. General appearance: alert Abdomen: soft, non-tender; bowel sounds normal; no masses,  no organomegaly Pelvic: cervix normal in appearance, external genitalia normal, no adnexal masses or tenderness, no cervical motion tenderness, rectovaginal septum normal, uterus normal size, shape, and consistency and cystocele /retocele   Lab Results  Component Value Date   WBC 5.4 07/02/2018   HGB 12.6 06/15/2019   HCT 37.0 06/15/2019   MCV 89.3 07/02/2018   PLT 278 07/02/2018   Lab Results  Component Value Date   PREGTESTUR NEGATIVE 01/04/2015      Patient Active Problem List   Diagnosis Date Noted  . Anxiety 07/02/2016  . Asthma 07/02/2016  . Depression 07/02/2016  . Hypertension 07/02/2016  . Lapband APS + [facility] repair April 2009 06/30/2012  .  Obesity-preop weight 314 10/23/2011  . Melanoma of back-excision of bilateral SLNBx axillary Feb 2010 10/23/2011  . Vulvar cancer ([place]) 10/22/2011  . Diabetes mellitus type 2, uncomplicated ([place]) 51/70/0174   IMP- retained IUD Plan/ Hysteroscopic removal of IUD.   Jefferson E 06/15/2019, 11:24 AM

## 2019-06-15 NOTE — Transfer of Care (Signed)
Immediate Anesthesia Transfer of Care Note  Patient: Ariel Stewart  Procedure(s) Performed: HYSTEROSCOPY (N/A Perineum) INTRAUTERINE DEVICE (IUD) REMOVAL (N/A Perineum)  Patient Location: PACU  Anesthesia Type:General  Level of Consciousness: awake, alert , oriented and patient cooperative  Airway & Oxygen Therapy: Patient Spontanous Breathing and Patient connected to nasal cannula oxygen  Post-op Assessment: Report given to RN and Post -op Vital signs reviewed and stable  Post vital signs: Reviewed and stable  Last Vitals:  Vitals Value Taken Time  BP    Temp    Pulse 81 06/15/19 1221  Resp 19 06/15/19 1221  SpO2 96 % 06/15/19 1221  Vitals shown include unvalidated device data.  Last Pain:  Vitals:   06/15/19 1045  TempSrc:   PainSc: 0-No pain      Patients Stated Pain Goal: 6 (75/17/00 1749)  Complications: No apparent anesthesia complications

## 2019-06-15 NOTE — Anesthesia Preprocedure Evaluation (Signed)
Anesthesia Evaluation  Patient identified by MRN, date of birth, ID band Patient awake    Reviewed: Allergy & Precautions, NPO status , Patient's Chart, lab work & pertinent test results  History of Anesthesia Complications (+) history of anesthetic complications  Airway Mallampati: II  TM Distance: >3 FB Neck ROM: Full    Dental no notable dental hx. (+) Teeth Intact   Pulmonary asthma , sleep apnea , Current Smoker and Patient abstained from smoking., former smoker,  Mild OSA  CXR: borderline cardiomegaly   Pulmonary exam normal breath sounds clear to auscultation       Cardiovascular Exercise Tolerance: Good negative cardio ROS Normal cardiovascular exam Rhythm:Regular Rate:Normal  ECG: NSR, low voltage.   Neuro/Psych PSYCHIATRIC DISORDERS Anxiety Depression negative neurological ROS     GI/Hepatic Neg liver ROS, hiatal hernia, GERD  Medicated,  Endo/Other  diabetes, Well Controlled, Type 2, Oral Hypoglycemic AgentsMorbid obesityDiet controlled diabetes.  Renal/GU negative Renal ROS  negative genitourinary   Musculoskeletal  (+) Arthritis ,   Abdominal (+) + obese,   Peds negative pediatric ROS (+)  Hematology negative hematology ROS (+)   Anesthesia Other Findings   Reproductive/Obstetrics negative OB ROS                             Anesthesia Physical  Anesthesia Plan  ASA: III  Anesthesia Plan: General   Post-op Pain Management:    Induction: Intravenous  PONV Risk Score and Plan: 4 or greater and Ondansetron, Dexamethasone, Midazolam and Scopolamine patch - Pre-op  Airway Management Planned: LMA  Additional Equipment: None  Intra-op Plan:   Post-operative Plan: Extubation in OR  Informed Consent: I have reviewed the patients History and Physical, chart, labs and discussed the procedure including the risks, benefits and alternatives for the proposed anesthesia  with the patient or authorized representative who has indicated his/her understanding and acceptance.     Dental advisory given  Plan Discussed with: CRNA  Anesthesia Plan Comments:         Anesthesia Quick Evaluation

## 2019-06-16 ENCOUNTER — Encounter (HOSPITAL_BASED_OUTPATIENT_CLINIC_OR_DEPARTMENT_OTHER): Payer: Self-pay | Admitting: Obstetrics and Gynecology

## 2019-06-16 NOTE — Op Note (Signed)
NAME: Ariel Stewart, Ariel Stewart MEDICAL RECORD [id number] ACCOUNT [id number] DATE OF BIRTH:09-16-1966 FACILITY: [facility] LOCATION: [facility] PHYSICIAN:Kirstan Fentress Kathline Magic, MD  OPERATIVE REPORT  DATE OF PROCEDURE:  06/15/2019  PREOPERATIVE DIAGNOSES:   1.  Retained intrauterine device. 2.  Difficult visualization of the cervix.  POSTOPERATIVE DIAGNOSES: 1.  Retained intrauterine device. 2.  Difficult visualization of the cervix.  PROCEDURE: 1.  Hysteroscopy. 2.  Removal of Mirena IUD.  SURGEON:  Freda Munro, MD  ANESTHESIA:  General and local.  ANTIBIOTICS:  Ancef 2 g.  DRAINS:  Red rubber catheter bladder.  SPECIMEN:  IUD removed intact and disposed of in trash can.  ESTIMATED BLOOD LOSS:  Minimal.  COMPLICATIONS:  None.  DESCRIPTION OF PROCEDURE:  The patient was taken to the operating room where a general anesthetic was administered without difficulty.  She was then placed in dorsal lithotomy position.  She was prepped and draped in the usual fashion for hysteroscopy.   A sterile speculum was placed in the vagina.  15 mL of 1% lidocaine was used for paracervical block.  The cervix was grasped anteriorly and serially dilated to a 21 [place].  The hysteroscope was advanced through the endocervical canal and the IUD  identified.  The patient had no other abnormalities in the uterine cavity.  Ostia were both visualized.  There were no submucous fibroids or polyps.  The hysteroscope was removed, and then packing forceps were placed into the uterine cavity where the IUD  was removed on the second attempt.  The patient was then awoken and taken to recovery room in stable condition.  Instrument and lap counts were correct x2.  She will be discharged to home.  She will be sent home with that _____ as needed.  She will  follow up in the office in 1 month.  LN/NUANCE  D:06/15/2019 T:06/15/2019 JOB:[id number]

## 2019-07-25 ENCOUNTER — Other Ambulatory Visit: Payer: Self-pay | Admitting: Surgery

## 2019-07-25 DIAGNOSIS — K219 Gastro-esophageal reflux disease without esophagitis: Secondary | ICD-10-CM

## 2019-07-29 ENCOUNTER — Ambulatory Visit
Admission: RE | Admit: 2019-07-29 | Discharge: 2019-07-29 | Disposition: A | Discharge status: Home / Self Care | Payer: 59 | Source: Ambulatory Visit | Attending: Surgery | Admitting: Surgery

## 2019-07-29 ENCOUNTER — Other Ambulatory Visit: Payer: Self-pay | Admitting: Surgery

## 2019-07-29 DIAGNOSIS — K219 Gastro-esophageal reflux disease without esophagitis: Secondary | ICD-10-CM

## 2020-05-03 HISTORY — PX: OTHER SURGICAL HISTORY: SHX169

## 2020-06-03 HISTORY — PX: OTHER SURGICAL HISTORY: SHX169

## 2021-02-21 ENCOUNTER — Telehealth: Payer: Self-pay | Admitting: *Deleted

## 2021-02-21 NOTE — Telephone Encounter (Signed)
Patient called and scheduled a follow up appt  

## 2021-03-05 ENCOUNTER — Encounter: Payer: Self-pay | Admitting: Obstetrics & Gynecology

## 2021-03-05 NOTE — Assessment & Plan Note (Addendum)
55 yo w/ stage IB Vulva cancer  Negative symptom review, normal exam.  No evidence of recurrence Suspect MSK etiology of vague abdominal pain Exam suggestive of HS  Return prn or 1 yr.  F/U w/a generalist gynecologist is appropriate.

## 2021-03-05 NOTE — Progress Notes (Signed)
Follow Up Note: Gyn-Onc  Ariel Stewart 55 y.o. female  CC:  Chief Complaint  Patient presents with  . Vulvar cancer [facility])  She presents for surveillance.  HPI:  Oncology History  Vulvar cancer ([place])  03/21/2010 Initial Diagnosis   Vulvar cancer   06/04/2010 Surgery   WLE and bilateral groin node dissection. IB Vulvar Ca     Interval History: She denies any new lesions, itching, leg/groin pain, urinary symptoms, leg swelling, weight loss or cough.  She is being followed by Gen-surg for recurrent boils.  Review of Systems  Review of Systems  Constitutional: Negative for weight loss.  Respiratory: Negative for cough.   Gastrointestinal: Positive for abdominal pain.  Genitourinary: Negative for dysuria, frequency, hematuria and urgency.     Current Meds:  Outpatient Encounter Medications as of 03/06/2021  Medication Sig  . cetirizine (ZYRTEC) 10 MG tablet Take 10 mg by mouth at bedtime.  . fluticasone furoate-vilanterol (BREO ELLIPTA) 100-25 MCG/INH AEPB Inhale 1 puff into the lungs daily.  . mupirocin ointment (BACTROBAN) 2 % Apply 1 application topically daily as needed (yeast under belly).   Marland Kitchen omeprazole (PRILOSEC) 20 MG capsule Take 20 mg by mouth every evening.   . VENTOLIN HFA 108 (90 BASE) MCG/ACT inhaler Inhale 1 puff into the lungs as needed.   . Vitamin D, Ergocalciferol, (DRISDOL) 50000 UNITS CAPS capsule Take 50,000 Units by mouth 2 (two) times a week. Sunday and Monday  . ALPRAZolam (XANAX) 1 MG tablet Take 1 mg by mouth 2 (two) times daily as needed for anxiety.  (Patient not taking: Reported on 03/05/2021)  . metFORMIN (GLUCOPHAGE) 1000 MG tablet Take 1,000 mg by mouth daily with breakfast.  . SUMAtriptan (IMITREX) 25 MG tablet Take 25 mg by mouth every 2 (two) hours as needed for migraine or headache. May repeat in 2 hours if headache persists or recurs. (Patient not taking: Reported on  03/05/2021)  . [DISCONTINUED] citalopram (CELEXA) 40 MG tablet Take 40 mg by mouth every evening.   . [DISCONTINUED] Cyanocobalamin (VITAMIN B 12 PO) Take 1 tablet by mouth daily.  . [DISCONTINUED] DICLOFENAC PO Take 1 tablet by mouth at bedtime.  . [DISCONTINUED] GABAPENTIN PO Take 1 tablet by mouth at bedtime.   No facility-administered encounter medications on file as of 03/06/2021.    Allergy:  Allergies  Allergen Reactions  . Sulfa Antibiotics Swelling    Genital swelling   . Varenicline Other (See Comments)    Depression/Suicidal Ideation  . Wellbutrin [Bupropion] Other (See Comments) and Anxiety     agaition  . Penicillins Rash    Has patient had a PCN reaction causing immediate rash, facial/tongue/throat swelling, SOB or lightheadedness with hypotension: Does not remember if immediate Has patient had a PCN reaction causing severe rash involving mucus membranes or skin necrosis: Yes Has patient had a PCN reaction that required hospitalization Yes Has patient had a PCN reaction occurring within the last 10 years: No If all of the above answers are "NO", then may proceed with Cephalosporin use.     Social Hx:   Social History   Socioeconomic History  . Marital status: Married    Spouse name: Not on file  . Number of children: Not on file  . Years of education: Not on file  . Highest education level: Not on file  Occupational History  . Not on file  Tobacco Use  . Smoking status: Former Smoker    Packs/day: 0.50    Years: 30.00  Pack years: 15.00    Types: Cigarettes    Quit date: 02/22/2017    Years since quitting: 4.0  . Smokeless tobacco: Never Used  Vaping Use  . Vaping Use: Former  . Quit date: 06/09/2017  . Substances: Nicotine, Flavoring  Substance and Sexual Activity  . Alcohol use: Yes    Comment: rare  . Drug use: Never  . Sexual activity: Yes    Birth control/protection: Surgical  Other Topics Concern  . Not on file  Social History Narrative  .  Not on file   Social Determinants of Health   Financial Resource Strain: Not on file  Food Insecurity: Not on file  Transportation Needs: Not on file  Physical Activity: Not on file  Stress: Not on file  Social Connections: Not on file  Intimate Partner Violence: Not on file    Past Surgical Hx:  Past Surgical History:  Procedure Laterality Date  . BILATERAL SUPERFICIAL INGUINAL LYMPH NODE DISSECTION AND WIDE LOCAL EXCISION VULVA  06-11-2010  . CARDIAC CATHETERIZATION  01/27/2015   @[facility]  [place]   normal coronary arteries and LVF   . CESAREAN SECTION  may 2001 &  sept 2002   BILATERAL TUBAL LIGATION WITH LAST C/S  . CYSTO/  RETOGRADE PYELOGRAM/  LEFT URETERAL STONE EXTRACTION  07-19-2003  . ELBOW DEBRIDEMENT Left 07-27-2008  . EXTRACORPOREAL SHOCK WAVE LITHOTRIPSY  x2 in 2009 // x1 2010  . HYSTEROSCOPY N/A 06/15/2019   Procedure: HYSTEROSCOPY;  Surgeon: Olga Millers, MD;  Location[facility];  Service: Gynecology;  Laterality: N/A;  . IUD REMOVAL N/A 06/15/2019   Procedure: INTRAUTERINE DEVICE (IUD) REMOVAL;  Surgeon: Olga Millers, MD;  Location: [facility];  Service: Gynecology;  Laterality: N/A;  . KNEE ARTHROSCOPY Bilateral left 12-07-2008 & 1988/  right 1989  . LAPAROSCOPIC GASTRIC BANDING  02-14-2008   AND  CLOSURE HIATAL HERNIA  . TONSILLECTOMY  1985  . Triger releas Left 06/2020   milddle finger  . Triger release Right 05/2020   middle finger  . VULVAR LESION REMOVAL N/A 01/04/2015   Procedure: WIDE LOCAL EXCISION OF RIGHT VULVA;  Surgeon: Imagene Gurney A. Alycia Rossetti, MD;  Location: [facility];  Service: Gynecology;  Laterality: N/A;  . WIDE EXCISION MELANOMA FROM BACK WITH PRIMARY CLOSURE AND BILATERAL AXILLARY SENTINAL LYMPH NODE DISSECTION  12-19-2008  . WIDE LOCAL EXCISION VULVA  03-19-2010    Past Medical Hx:  Past Medical History:  Diagnosis Date  . Arthritis   . Asthma with COPD ([place])    [place], note in epic , Royden Purl FNP  . Bulging lumbar disc    L4-5  . Complication of anesthesia    PT STATES SEVERE ITCHING POST-OP-- REQUEST ANTIHISTAMINE   . GERD (gastroesophageal reflux disease)   . Hiatal hernia   . History of adjustable gastric banding   . 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 History of chest pain    had cardiac cath for abnormal stress test 01-27-2015  normal coronaries and lvf  (06-10-2019  pt denies any cardiac s&s)  . History of hypertension    no issue or meds since gastric banding with wt loss  . History of iron deficiency anemia    per pt hx IV Iron infusion  . History of kidney stones   . History of melanoma excision  BACK --  WIDE EXCISION REMOVAL IN 2010  . History of palpitations    02/ 2019 [facility] cardiology evaluation,  event monitor 03-/ 2019 showed SR and ST with occasional PVCs and PACs  (06-10-2019  pt denied any palpitations)  . Nocturia   . OSA treated with BiPAP   . Type 2 diabetes mellitus ([place])    followed by pcp  . Wears glasses     Family Hx:  Family History  Problem Relation Age of Onset  . Diabetes Mother   . Heart failure Mother   . Diabetes Father      Physical Exam:  Physical Exam Exam conducted with a chaperone present.  Abdominal:     Palpations: Abdomen is soft.     Tenderness: There is abdominal tenderness (mild, LLQ).  Genitourinary:    Vagina: Normal.     Cervix: Normal.     Uterus: Normal.      Adnexa: Right adnexa normal and left adnexa normal.     Comments: Labia minora absent.  Left inner thigh w/inflammatory nodule, scarring Neurological:     Mental Status: She is alert.     Assessment/Plan:  Vulvar cancer 55 yo w/ stage IB Vulva cancer  Negative symptom review, normal exam.  No evidence of recurrence Suspect MSK etiology of vague abdominal pain Exam suggestive of HS  Return prn or 1 yr.  F/U w/a generalist  gynecologist is appropriate.  I personally spent 25 minutes face-to-face and non-face-to-face in the care of this patient, which includes all pre, intra, and post visit time on the date of service.  Lahoma Crocker, MD 03/05/2021, 3:03 PM

## 2021-03-06 ENCOUNTER — Inpatient Hospital Stay (HOSPITAL_BASED_OUTPATIENT_CLINIC_OR_DEPARTMENT_OTHER): Payer: 59 | Admitting: Obstetrics & Gynecology

## 2021-03-06 ENCOUNTER — Encounter: Payer: Self-pay | Admitting: Obstetrics & Gynecology

## 2021-03-06 ENCOUNTER — Inpatient Hospital Stay: Payer: 59 | Attending: Obstetrics & Gynecology | Admitting: Obstetrics & Gynecology

## 2021-03-06 ENCOUNTER — Other Ambulatory Visit: Payer: Self-pay

## 2021-03-06 VITALS — BP 108/63 | HR 84 | Temp 96.2°F | Resp 20 | Ht 65.0 in | Wt 220.6 lb

## 2021-03-06 DIAGNOSIS — Z8544 Personal history of malignant neoplasm of other female genital organs: Secondary | ICD-10-CM | POA: Insufficient documentation

## 2021-03-06 DIAGNOSIS — C519 Malignant neoplasm of vulva, unspecified: Secondary | ICD-10-CM

## 2021-03-06 NOTE — Patient Instructions (Signed)
Return in 1 yr 

## 2022-09-22 ENCOUNTER — Other Ambulatory Visit: Payer: Self-pay | Admitting: Obstetrics and Gynecology

## 2022-09-22 DIAGNOSIS — R928 Other abnormal and inconclusive findings on diagnostic imaging of breast: Secondary | ICD-10-CM

## 2022-10-09 ENCOUNTER — Ambulatory Visit: Admission: RE | Admit: 2022-10-09 | Payer: 59 | Source: Ambulatory Visit

## 2022-10-09 ENCOUNTER — Ambulatory Visit
Admission: RE | Admit: 2022-10-09 | Discharge: 2022-10-09 | Disposition: A | Discharge status: Home / Self Care | Payer: 59 | Source: Ambulatory Visit | Attending: Obstetrics and Gynecology | Admitting: Obstetrics and Gynecology

## 2022-10-09 DIAGNOSIS — R928 Other abnormal and inconclusive findings on diagnostic imaging of breast: Secondary | ICD-10-CM

## 2024-07-27 ENCOUNTER — Other Ambulatory Visit: Payer: Self-pay | Admitting: Medical Genetics

## 2024-10-17 ENCOUNTER — Other Ambulatory Visit (HOSPITAL_COMMUNITY)

## 2024-10-26 ENCOUNTER — Other Ambulatory Visit (HOSPITAL_COMMUNITY)

## 2024-11-28 ENCOUNTER — Other Ambulatory Visit: Payer: Self-pay | Admitting: Medical Genetics

## 2024-11-28 DIAGNOSIS — Z006 Encounter for examination for normal comparison and control in clinical research program: Secondary | ICD-10-CM

## 2024-12-15 ENCOUNTER — Other Ambulatory Visit (HOSPITAL_COMMUNITY)
